# Patient Record
Sex: Female | Born: 1985 | Race: White | Hispanic: No | Marital: Single | Smoking: Current every day smoker
Health system: Southern US, Community
[De-identification: ages and names within clinical notes are randomized; demographics above are authoritative.]

## PROBLEM LIST (undated history)

## (undated) ENCOUNTER — Inpatient Hospital Stay (HOSPITAL_COMMUNITY): Payer: Self-pay

## (undated) DIAGNOSIS — F319 Bipolar disorder, unspecified: Secondary | ICD-10-CM

## (undated) DIAGNOSIS — Z3490 Encounter for supervision of normal pregnancy, unspecified, unspecified trimester: Principal | ICD-10-CM

## (undated) DIAGNOSIS — F329 Major depressive disorder, single episode, unspecified: Secondary | ICD-10-CM

## (undated) DIAGNOSIS — F32A Depression, unspecified: Secondary | ICD-10-CM

## (undated) HISTORY — PX: TONSILLECTOMY: SUR1361

## (undated) HISTORY — PX: BREAST SURGERY: SHX581

---

## 2008-09-23 ENCOUNTER — Emergency Department (HOSPITAL_COMMUNITY): Admission: EM | Admit: 2008-09-23 | Discharge: 2008-09-24 | Payer: Self-pay | Admitting: Emergency Medicine

## 2008-12-20 ENCOUNTER — Inpatient Hospital Stay (HOSPITAL_COMMUNITY): Admission: AD | Admit: 2008-12-20 | Discharge: 2008-12-20 | Payer: Self-pay | Admitting: Obstetrics & Gynecology

## 2008-12-20 ENCOUNTER — Emergency Department (HOSPITAL_COMMUNITY): Admission: EM | Admit: 2008-12-20 | Discharge: 2008-12-20 | Payer: Self-pay | Admitting: Emergency Medicine

## 2008-12-22 ENCOUNTER — Inpatient Hospital Stay (HOSPITAL_COMMUNITY): Admission: AD | Admit: 2008-12-22 | Discharge: 2008-12-22 | Payer: Self-pay | Admitting: Obstetrics & Gynecology

## 2009-02-19 ENCOUNTER — Emergency Department (HOSPITAL_COMMUNITY): Admission: EM | Admit: 2009-02-19 | Discharge: 2009-02-20 | Payer: Self-pay | Admitting: Emergency Medicine

## 2009-04-07 ENCOUNTER — Emergency Department (HOSPITAL_COMMUNITY): Admission: EM | Admit: 2009-04-07 | Discharge: 2009-04-07 | Payer: Self-pay | Admitting: Emergency Medicine

## 2010-11-09 ENCOUNTER — Inpatient Hospital Stay (HOSPITAL_COMMUNITY)
Admission: AD | Admit: 2010-11-09 | Discharge: 2010-11-09 | Payer: Self-pay | Source: Home / Self Care | Attending: Obstetrics and Gynecology | Admitting: Obstetrics and Gynecology

## 2010-11-11 ENCOUNTER — Inpatient Hospital Stay (HOSPITAL_COMMUNITY): Admission: AD | Admit: 2010-11-11 | Payer: Self-pay | Source: Home / Self Care | Admitting: Obstetrics and Gynecology

## 2011-01-28 LAB — GC/CHLAMYDIA PROBE AMP, GENITAL
Chlamydia, DNA Probe: NEGATIVE
GC Probe Amp, Genital: NEGATIVE

## 2011-01-28 LAB — CBC
Hemoglobin: 12 g/dL (ref 12.0–15.0)
MCHC: 33.3 g/dL (ref 30.0–36.0)
RDW: 12.8 % (ref 11.5–15.5)
WBC: 10 10*3/uL (ref 4.0–10.5)

## 2011-01-28 LAB — WET PREP, GENITAL: Trich, Wet Prep: NONE SEEN

## 2011-01-28 LAB — ABO/RH: ABO/RH(D): B POS

## 2011-01-28 LAB — HCG, QUANTITATIVE, PREGNANCY: hCG, Beta Chain, Quant, S: 35 m[IU]/mL — ABNORMAL HIGH (ref <5)

## 2011-02-27 LAB — URINALYSIS, ROUTINE W REFLEX MICROSCOPIC
Bilirubin Urine: NEGATIVE
Hgb urine dipstick: NEGATIVE
Protein, ur: NEGATIVE mg/dL
Urobilinogen, UA: 0.2 mg/dL (ref 0.0–1.0)

## 2011-02-27 LAB — URINE MICROSCOPIC-ADD ON

## 2011-03-05 LAB — GC/CHLAMYDIA PROBE AMP, GENITAL
Chlamydia, DNA Probe: NEGATIVE
GC Probe Amp, Genital: NEGATIVE

## 2011-03-05 LAB — POCT URINALYSIS DIP (DEVICE)
Glucose, UA: NEGATIVE mg/dL
Protein, ur: NEGATIVE mg/dL
Specific Gravity, Urine: 1.01 (ref 1.005–1.030)
pH: 8.5 — ABNORMAL HIGH (ref 5.0–8.0)

## 2011-03-05 LAB — CBC
MCHC: 33.5 g/dL (ref 30.0–36.0)
MCV: 84.2 fL (ref 78.0–100.0)
RBC: 4.58 MIL/uL (ref 3.87–5.11)
RDW: 12.8 % (ref 11.5–15.5)
WBC: 10.7 10*3/uL — ABNORMAL HIGH (ref 4.0–10.5)

## 2011-03-05 LAB — WET PREP, GENITAL: Trich, Wet Prep: NONE SEEN

## 2011-03-05 LAB — POCT PREGNANCY, URINE: Preg Test, Ur: POSITIVE

## 2011-03-31 ENCOUNTER — Emergency Department (HOSPITAL_COMMUNITY)
Admission: EM | Admit: 2011-03-31 | Discharge: 2011-03-31 | Disposition: A | Discharge status: Home / Self Care | Payer: Self-pay | Attending: Emergency Medicine | Admitting: Emergency Medicine

## 2011-03-31 DIAGNOSIS — F319 Bipolar disorder, unspecified: Secondary | ICD-10-CM | POA: Insufficient documentation

## 2011-03-31 DIAGNOSIS — R21 Rash and other nonspecific skin eruption: Secondary | ICD-10-CM | POA: Insufficient documentation

## 2011-03-31 DIAGNOSIS — L259 Unspecified contact dermatitis, unspecified cause: Secondary | ICD-10-CM | POA: Insufficient documentation

## 2011-03-31 DIAGNOSIS — Z79899 Other long term (current) drug therapy: Secondary | ICD-10-CM | POA: Insufficient documentation

## 2011-04-29 ENCOUNTER — Inpatient Hospital Stay (HOSPITAL_COMMUNITY)
Admission: RE | Admit: 2011-04-29 | Discharge: 2011-04-29 | Disposition: A | Discharge status: Home / Self Care | Payer: Self-pay | Source: Ambulatory Visit | Attending: Emergency Medicine | Admitting: Emergency Medicine

## 2011-06-18 ENCOUNTER — Emergency Department (HOSPITAL_COMMUNITY)
Admission: EM | Admit: 2011-06-18 | Discharge: 2011-06-19 | Disposition: A | Discharge status: Home / Self Care | Payer: Self-pay | Attending: Emergency Medicine | Admitting: Emergency Medicine

## 2011-06-18 DIAGNOSIS — J45909 Unspecified asthma, uncomplicated: Secondary | ICD-10-CM | POA: Insufficient documentation

## 2011-06-18 DIAGNOSIS — R220 Localized swelling, mass and lump, head: Secondary | ICD-10-CM | POA: Insufficient documentation

## 2011-06-18 DIAGNOSIS — F319 Bipolar disorder, unspecified: Secondary | ICD-10-CM | POA: Insufficient documentation

## 2011-06-18 DIAGNOSIS — I10 Essential (primary) hypertension: Secondary | ICD-10-CM | POA: Insufficient documentation

## 2011-06-18 DIAGNOSIS — K047 Periapical abscess without sinus: Secondary | ICD-10-CM | POA: Insufficient documentation

## 2011-08-20 LAB — CBC
HCT: 38.9
Hemoglobin: 13.3
MCHC: 34.2
MCV: 83.4
Platelets: 241
RDW: 14.1

## 2011-08-20 LAB — URINALYSIS, ROUTINE W REFLEX MICROSCOPIC
Protein, ur: 30 — AB
Specific Gravity, Urine: 1.016
Urobilinogen, UA: 0.2

## 2011-08-20 LAB — DIFFERENTIAL
Basophils Absolute: 0.1
Basophils Relative: 1
Eosinophils Absolute: 0.1
Eosinophils Relative: 1
Monocytes Absolute: 0.6

## 2011-08-20 LAB — POCT I-STAT, CHEM 8
BUN: 6
Calcium, Ion: 1.18
Hemoglobin: 12.9
Sodium: 141
TCO2: 24

## 2011-08-20 LAB — URINE MICROSCOPIC-ADD ON

## 2011-09-28 ENCOUNTER — Emergency Department (HOSPITAL_COMMUNITY): Payer: Self-pay

## 2011-09-28 ENCOUNTER — Encounter: Payer: Self-pay | Admitting: *Deleted

## 2011-09-28 ENCOUNTER — Emergency Department (HOSPITAL_COMMUNITY)
Admission: EM | Admit: 2011-09-28 | Discharge: 2011-09-28 | Disposition: A | Discharge status: Home / Self Care | Payer: Self-pay | Attending: Emergency Medicine | Admitting: Emergency Medicine

## 2011-09-28 DIAGNOSIS — M79609 Pain in unspecified limb: Secondary | ICD-10-CM | POA: Insufficient documentation

## 2011-09-28 DIAGNOSIS — J45909 Unspecified asthma, uncomplicated: Secondary | ICD-10-CM | POA: Insufficient documentation

## 2011-09-28 DIAGNOSIS — W230XXA Caught, crushed, jammed, or pinched between moving objects, initial encounter: Secondary | ICD-10-CM | POA: Insufficient documentation

## 2011-09-28 DIAGNOSIS — S61209A Unspecified open wound of unspecified finger without damage to nail, initial encounter: Secondary | ICD-10-CM | POA: Insufficient documentation

## 2011-09-28 DIAGNOSIS — S6990XA Unspecified injury of unspecified wrist, hand and finger(s), initial encounter: Secondary | ICD-10-CM

## 2011-09-28 HISTORY — DX: Depression, unspecified: F32.A

## 2011-09-28 HISTORY — DX: Major depressive disorder, single episode, unspecified: F32.9

## 2011-09-28 MED ORDER — DOXYCYCLINE HYCLATE 100 MG PO CAPS: 100.0000 mg | ORAL_CAPSULE | Freq: Two times a day (BID) | ORAL | Status: AC

## 2011-09-28 MED ORDER — HYDROMORPHONE HCL PF 2 MG/ML IJ SOLN
2.0000 mg | Freq: Once | INTRAMUSCULAR | Status: AC
  Administered 2011-09-28: 2 mg via INTRAVENOUS
  Filled 2011-09-28: qty 1

## 2011-09-28 MED ORDER — OXYCODONE-ACETAMINOPHEN 5-325 MG PO TABS: 1.0000 | ORAL_TABLET | Freq: Four times a day (QID) | ORAL | Status: AC | PRN

## 2011-09-28 MED ORDER — HYDROMORPHONE HCL PF 1 MG/ML IJ SOLN
1.0000 mg | Freq: Once | INTRAMUSCULAR | Status: AC
  Administered 2011-09-28: 1 mg via INTRAMUSCULAR
  Filled 2011-09-28: qty 1

## 2011-09-28 MED ORDER — TETANUS-DIPHTH-ACELL PERTUSSIS 5-2.5-18.5 LF-MCG/0.5 IM SUSP
0.5000 mL | Freq: Once | INTRAMUSCULAR | Status: AC
  Administered 2011-09-28: 0.5 mL via INTRAMUSCULAR
  Filled 2011-09-28: qty 0.5

## 2011-09-28 NOTE — ED Notes (Signed)
Pt is no longer crying.  Feels better.  Pain decreased from 10/10 - 6/10

## 2011-09-28 NOTE — ED Notes (Signed)
Dressing.  Cleaned wound, applied bacitracing and non stick dressing with gauze over it.  Pt tol well.  Wound care instructions given

## 2011-09-28 NOTE — ED Notes (Signed)
Pt got right pinkie finger caught b/w door and chair and now appears to have been smashed and nail missing

## 2011-09-28 NOTE — ED Provider Notes (Signed)
History     CSN: [id number] Arrival date & time: 09/28/2011  1:51 PM   First MD Initiated Contact with Patient 09/28/11 1720      Chief Complaint  Patient presents with  . Finger Injury    right pinkie    (Consider location/radiation/quality/duration/timing/severity/associated sxs/prior treatment) Patient is a 25 y.o. female presenting with hand injury. The history is provided by the patient (The patient crushed her right small finger now in a door frame they removed her).  Hand Injury  The incident occurred 1 to 2 hours ago. The incident occurred at home. The injury mechanism was a direct blow. The pain is present in the right fingers. The quality of the pain is described as aching. The pain is at a severity of 5/10. The pain is moderate. The pain has been constant since the incident. Pertinent negatives include no fever. She reports no foreign bodies present. The symptoms are aggravated by movement. She has tried immobilization for the symptoms.    Past Medical History  Diagnosis Date  . Asthma   . Depression     Past Surgical History  Procedure Date  . Tonsillectomy   . Breast surgery     No family history on file.  History  Substance Use Topics  . Smoking status: Current Everyday Smoker  . Smokeless tobacco: Not on file  . Alcohol Use: No    OB History    Grav Para Term Preterm Abortions TAB SAB Ect Mult Living                  Review of Systems  Constitutional: Negative for fever and fatigue.  HENT: Negative for congestion, sinus pressure and ear discharge.   Eyes: Negative for discharge.  Respiratory: Negative for cough.   Cardiovascular: Negative for chest pain.  Gastrointestinal: Negative for abdominal pain and diarrhea.  Genitourinary: Negative for frequency and hematuria.  Musculoskeletal: Negative for back pain.       Pain in right small finger  Skin: Negative for rash.  Neurological: Negative for seizures and headaches.  Hematological: Negative.    Psychiatric/Behavioral: Negative for hallucinations.    Allergies  Latex and Lamictal  Home Medications   Current Outpatient Rx  Name Route Sig Dispense Refill  . SERTRALINE HCL 50 MG PO TABS Oral Take 50 mg by mouth daily.      Marland Kitchen DOXYCYCLINE HYCLATE 100 MG PO CAPS Oral Take 1 capsule (100 mg total) by mouth 2 (two) times daily. 14 capsule 0  . OXYCODONE-ACETAMINOPHEN 5-325 MG PO TABS Oral Take 1 tablet by mouth every 6 (six) hours as needed for pain. 20 tablet 0    BP 136/88  Pulse 52  Temp(Src) 98.7 F (37.1 C) (Oral)  Resp 22  SpO2 99%  Physical Exam  Constitutional: She is oriented to person, place, and time. She appears well-developed.  HENT:  Head: Normocephalic.  Eyes: Conjunctivae are normal.  Neck: No tracheal deviation present.  Musculoskeletal:       Patient has injury to her right small finger she has full range of motion but her nail was removed she has a nailbed laceration neurovascular exam normal  Neurological: She is oriented to person, place, and time.  Skin: Skin is warm.  Psychiatric: She has a normal mood and affect.    ED Course  Procedures (including critical care time)  Labs Reviewed - No data to display Dg Hand Complete Right  09/28/2011  *RADIOLOGY REPORT*  Clinical Data: Finger injury  RIGHT  HAND - COMPLETE 3+ VIEW  Comparison: None.  Findings: There is an injury to the tuft of the distal phalanx of the fifth digit.  There is a fracture of the tuft.  No soft tissue injury.  The DIP joint appears normal.  IMPRESSION: Fracture of the tuft of the fifth digit distal phalanx.  Original Report Authenticated By: Genevive Bi, M.D.     1. Finger injury    Results for orders placed during the hospital encounter of 11/09/10  POCT PREGNANCY, URINE      Component Value Range   Preg Test, Ur       Value: POSITIVE            THE SENSITIVITY OF THIS     METHODOLOGY IS >24 mIU/mL  WET PREP, GENITAL      Component Value Range   Yeast, Wet Prep  NONE SEEN  NONE SEEN    Trich, Wet Prep NONE SEEN  NONE SEEN    Clue Cells, Wet Prep FEW (*) NONE SEEN    WBC, Wet Prep HPF POC FEW MANY BACTERIA SEEN (*) NONE SEEN   GC/CHLAMYDIA PROBE AMP, GENITAL      Component Value Range   GC Probe Amp, Genital    NEGATIVE    Value: NEGATIVE     (NOTE)  Testing performed using the BD ProbeTec Qx Chlamydia trachomatis and Neisseria gonorrhea amplified DNA assay.  Performed at:  [facility]               [address]                    [place], [place] [address]               [id number]   Chlamydia, DNA Probe    NEGATIVE    Value: NEGATIVE     (NOTE)  Testing performed using the BD ProbeTec Qx Chlamydia trachomatis and Neisseria gonorrhea amplified DNA assay.  Performed at:  [facility]               [address]                    [place], [place] [address]               [id number]  ABO/RH      Component Value Range   ABO/RH(D) B POS    CBC      Component Value Range   WBC 10.0  4.0 - 10.5 (K/uL)   RBC 4.38  3.87 - 5.11 (MIL/uL)   Hemoglobin 12.0  12.0 - 15.0 (g/dL)   HCT 57.8  46.9 - 62.9 (%)   MCV 82.2  78.0 - 100.0 (fL)   MCH 27.4  26.0 - 34.0 (pg)   MCHC 33.3  30.0 - 36.0 (g/dL)   RDW 52.8  41.3 - 24.4 (%)   Platelets 231  150 - 400 (K/uL)  HCG, QUANTITATIVE, PREGNANCY      Component Value Range   hCG, Beta Chain, Quant, S   (*) <5 (mIU/mL)   Value: 35              GEST. AGE      CONC.  (mIU/mL)       <=  1 WEEK        5 - 50         2 WEEKS       50 - 500         3 WEEKS       100 - 10,000         4 WEEKS     1,000 - 30,000         5 WEEKS     3,500 - 115,000       6-8 WEEKS     12,000 - 270,000        12 WEEKS     15,000 - 220,000                FEMALE AND NON-PREGNANT FEMALE:         LESS THAN 5 mIU/mL   Dg Hand Complete Right  09/28/2011  *RADIOLOGY REPORT*  Clinical Data: Finger injury  RIGHT HAND - COMPLETE 3+  VIEW  Comparison: None.  Findings: There is an injury to the tuft of the distal phalanx of the fifth digit.  There is a fracture of the tuft.  No soft tissue injury.  The DIP joint appears normal.  IMPRESSION: Fracture of the tuft of the fifth digit distal phalanx.  Original Report Authenticated By: Genevive Bi, M.D.   Patient was referred to    MDM          Benny Lennert, MD 09/28/11 [phone]

## 2011-09-28 NOTE — ED Notes (Signed)
Family is at the bedside.  Severe pain.  Crying.  Pain 9/10.  EDP notified, pain 9/10.  New orders received

## 2011-10-31 ENCOUNTER — Encounter (HOSPITAL_COMMUNITY): Payer: Self-pay | Admitting: *Deleted

## 2011-10-31 ENCOUNTER — Inpatient Hospital Stay (HOSPITAL_COMMUNITY)
Admission: AD | Admit: 2011-10-31 | Discharge: 2011-11-01 | Disposition: A | Discharge status: Home / Self Care | Payer: Medicaid Other | Source: Ambulatory Visit | Attending: Obstetrics & Gynecology | Admitting: Obstetrics & Gynecology

## 2011-10-31 ENCOUNTER — Inpatient Hospital Stay (HOSPITAL_COMMUNITY): Payer: Medicaid Other

## 2011-10-31 ENCOUNTER — Emergency Department (INDEPENDENT_AMBULATORY_CARE_PROVIDER_SITE_OTHER)
Admission: EM | Admit: 2011-10-31 | Discharge: 2011-10-31 | Disposition: A | Discharge status: Other Acute Inpatient Hospital | Payer: Self-pay | Source: Home / Self Care | Attending: Emergency Medicine | Admitting: Emergency Medicine

## 2011-10-31 DIAGNOSIS — O210 Mild hyperemesis gravidarum: Secondary | ICD-10-CM

## 2011-10-31 LAB — POCT I-STAT, CHEM 8
BUN: 6 mg/dL (ref 6–23)
Calcium, Ion: 1.15 mmol/L (ref 1.12–1.32)
Hemoglobin: 15 g/dL (ref 12.0–15.0)
Sodium: 136 mEq/L (ref 135–145)
TCO2: 25 mmol/L (ref 0–100)

## 2011-10-31 LAB — URINALYSIS, ROUTINE W REFLEX MICROSCOPIC
Glucose, UA: NEGATIVE mg/dL
Hgb urine dipstick: NEGATIVE
Ketones, ur: >80 mg/dL — AB
Protein, ur: 100 mg/dL — AB
Urobilinogen, UA: 1 mg/dL (ref 0.0–1.0)

## 2011-10-31 LAB — URINE MICROSCOPIC-ADD ON

## 2011-10-31 LAB — CBC
MCH: 27.6 pg (ref 26.0–34.0)
MCV: 80 fL (ref 78.0–100.0)
Platelets: 289 10*3/uL (ref 150–400)
RDW: 13.6 % (ref 11.5–15.5)
WBC: 13.9 10*3/uL — ABNORMAL HIGH (ref 4.0–10.5)

## 2011-10-31 LAB — POCT PREGNANCY, URINE: Preg Test, Ur: POSITIVE

## 2011-10-31 MED ORDER — ONDANSETRON 4 MG PO TBDP
8.0000 mg | ORAL_TABLET | Freq: Once | ORAL | Status: AC
  Administered 2011-10-31: 8 mg via ORAL

## 2011-10-31 MED ORDER — LACTATED RINGERS IV SOLN
INTRAVENOUS | Status: DC
  Administered 2011-10-31: 22:00:00 via INTRAVENOUS

## 2011-10-31 MED ORDER — ONDANSETRON 4 MG PO TBDP
4.0000 mg | ORAL_TABLET | Freq: Three times a day (TID) | ORAL | Status: DC | PRN
  Administered 2011-10-31: 4 mg via ORAL
  Filled 2011-10-31: qty 1

## 2011-10-31 MED ORDER — ONDANSETRON 4 MG PO TBDP
ORAL_TABLET | ORAL | Status: AC
  Filled 2011-10-31: qty 2

## 2011-10-31 NOTE — ED Notes (Signed)
C/o vomiting and states she cannot keep anything down x 3 days.  States emesis has become brownish red with blood in it.  Also c/o diarrhea. Pt. States last period Oct. 7, 2012 / she is pregnant but has not had a Dr's appt. Yet.

## 2011-10-31 NOTE — Progress Notes (Signed)
PT SAYS  SHE LEFT  URGENT CARE-    GAVE MED FOR NAUSEA. TOLD HER TO COME HERE FOR ADMISSION.  N/V HAS BEEN GOING X2 WEEKS-      HAS VOMITED 9X TODAY.

## 2011-10-31 NOTE — ED Provider Notes (Signed)
History     CSN: [id number] Arrival date & time: 10/31/2011  6:41 PM   First MD Initiated Contact with Patient 10/31/11 1809      Chief Complaint  Patient presents with  . Emesis    (Consider location/radiation/quality/duration/timing/severity/associated sxs/prior treatment) HPI Comments: Anniah is a 25 year old female who is about 2 months pregnant. She has had a two-week history of continuous vomiting of up to 7-8 times per day. For the past 3 days she's had blood in her vomitus which has been sometimes brown and sometimes red. Sometimes as little as teaspoonful and sometimes as much as a cup. She has had loose stools for the past 2 days, she's felt tired and chilled. Her last menstrual period was October 17. She doesn't have an obstetrician so far as had no prenatal care. She is still smoking. 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 She doesn't have any vaginal bleeding or pelvic pain.  Patient is a 25 y.o. female presenting with vomiting.  Emesis  Associated symptoms include diarrhea. Pertinent negatives include no abdominal pain, no chills, no cough and no fever.    Past Medical History  Diagnosis Date  . Asthma   . Depression     Past Surgical History  Procedure Date  . Tonsillectomy   . Breast surgery     History reviewed. No pertinent family history.  History  Substance Use Topics  . Smoking status: Current Everyday Smoker  . Smokeless tobacco: Not on file  . Alcohol Use: No    OB History    Grav Para Term Preterm Abortions TAB SAB Ect Mult Living                  Review of Systems  Constitutional: Negative for fever, chills, appetite change and unexpected weight change.  Respiratory: Negative for cough, shortness of breath and wheezing.   Cardiovascular: Negative for chest pain.  Gastrointestinal: Positive for nausea, vomiting and diarrhea. Negative for abdominal pain, constipation, blood in stool, abdominal distention, anal  bleeding and rectal pain.  Genitourinary: Negative for dysuria, urgency and frequency.  Skin: Negative for rash.    Allergies  Latex and Lamictal  Home Medications   Current Outpatient Rx  Name Route Sig Dispense Refill  . SERTRALINE HCL 50 MG PO TABS Oral Take 50 mg by mouth daily.        BP 132/83  Pulse 104  Temp(Src) 98.8 F (37.1 C) (Oral)  Resp 18  SpO2 98%  LMP 08/25/2011  Physical Exam  Nursing note and vitals reviewed. Constitutional: She appears well-developed and well-nourished. No distress.  Eyes: No scleral icterus.  Cardiovascular: Normal rate, regular rhythm and normal heart sounds.  Exam reveals no gallop and no friction rub.   No murmur heard. Pulmonary/Chest: Effort normal and breath sounds normal. No respiratory distress. She has no wheezes. She has no rales.  Abdominal: Soft. Bowel sounds are normal. She exhibits no distension and no mass. There is no hepatosplenomegaly. There is no tenderness. There is no rebound, no guarding and no CVA tenderness.  Skin: Skin is warm and dry. No rash noted. She is not diaphoretic.    ED Course  Procedures (including critical care time)  Labs Reviewed  POCT I-STAT, CHEM 8 - Abnormal; Notable for the following:    Potassium 3.2 (*)    All other components within normal limits  POCT PREGNANCY, URINE  I-STAT, CHEM 8  POCT PREGNANCY, URINE   No results found.  1. Hyperemesis gravidarum       MDM  She has hyperemesis gravidarum which is complicated by several things: First of all upper GI bleeding which may be do to a Mallory-Weiss tear, second she is a high-risk pregnancy with no prenatal care and 2 prior miscarriages, third she has hypokalemia and orthostatic drop in blood pressure. Because of all these things we are transferring her to [facility] hospital by private vehicle. Her husband is here with her and has agreed to take her right over there. We called and gave report to the charge  nurse.        Roque Lias, MD 10/31/11 [phone]

## 2011-10-31 NOTE — ED Provider Notes (Signed)
History   .25 yo Patient's last menstrual period was 08/25/2011.[redacted]w[redacted]d. She was seen at Urgent Care due to 2 weeks of persistent nausea and vomiting. Zofran ODT was given about 1-2 hrs ago and her symptoms have improved but has not attempted to take fluids by mouth yet. No pain, no bleeding.     HPI  OB History    Grav Para Term Preterm Abortions TAB SAB Ect Mult Living   3    2  2    0    2 first trimester SAb. No surgery.  Past Medical History  Diagnosis Date  . Asthma   . Depression     Past Surgical History  Procedure Date  . Tonsillectomy   . Breast surgery     History reviewed. No pertinent family history.  History  Substance Use Topics  . Smoking status: Current Everyday Smoker  . Smokeless tobacco: Not on file  . Alcohol Use: No    Allergies:  Allergies  Allergen Reactions  . Latex     rash  . Lamictal (Lamotrigine) Rash    Prescriptions prior to admission  Medication Sig Dispense Refill  . sertraline (ZOLOFT) 50 MG tablet Take 50 mg by mouth daily.          Review of Systems  Constitutional: Positive for weight loss. Negative for fever and chills.  Gastrointestinal: Positive for nausea and vomiting.       Blood in vomitus.  Genitourinary: Negative for dysuria and frequency.    Physical Exam   Blood pressure 118/66, pulse 59, temperature 98.9 F (37.2 C), temperature source Oral, resp. rate 18, height 5\' 6"  (1.676 m), weight 158 lb 4 oz (71.782 kg), last menstrual period 08/25/2011.  Physical Exam  Constitutional: She appears well-developed. No distress.  GI: Soft. She exhibits no distension and no mass. There is no tenderness.   CBC    Component Value Date/Time   WBC 13.9* 10/31/2011 2201   RBC 4.50 10/31/2011 2201   HGB 12.4 10/31/2011 2201   HCT 36.0 10/31/2011 2201   PLT 289 10/31/2011 2201   MCV 80.0 10/31/2011 2201   MCH 27.6 10/31/2011 2201   MCHC 34.4 10/31/2011 2201   RDW 13.6 10/31/2011 2201   LYMPHSABS 3.2 09/23/2008 2255   MONOABS 0.6 09/23/2008 2255   EOSABS 0.1 09/23/2008 2255   BASOSABS 0.1 09/23/2008 2255   US OB Comp Less 14 Wks Status:  Final result                     Study Result     *RADIOLOGY REPORT*  Clinical Data: Recurrent abortion. Hyper emesis. Beta HCG  pending. Gestational age by LMP is 9 weeks 4 days.  OBSTETRIC <14 WK ULTRASOUND  Technique: Transabdominal ultrasound was performed for evaluation  of the gestation as well as the maternal uterus and adnexal  regions.  Comparison: None.  Intrauterine gestational sac: A single intrauterine pregnancy is  visualized.  Yolk sac: Present  Embryo: Present  Cardiac Activity: Demonstrated  Heart Rate: bpm  CRL: 14 mm 7w 6d [place] EDC: 06/12/2012  Maternal uterus/Adnexae:  No focal myometrial masses are suggested. The right ovary measures  2.9 x 1.5 x 1.9 cm. No abnormal adnexal masses. The left ovary  measures 2.4 x 1.3 x 1.7 cm. No abnormal adnexal masses. No  significant free fluid.  IMPRESSION:  Single intrauterine pregnancy demonstrated with estimated  gestational age by crown-rump length 7 weeks 6 days.  Original Report Authenticated By: Chrissie Noa  R. STEVENS, M.D.      MAU Course  Procedures  MDM 7 weeks 6 days viable IUP, nausea and vomiting of pregnancy.  Assessment and Plan  Tolerating oral fluids after hydration and Zofran. Rx Phenergan 25 mg tabs po q4-6 for nausea. Make appt for prenatal care at health dept as she planned. Instructions given.  ARNOLD,JAMES 10/31/2011, 9:45 PM

## 2011-11-01 LAB — TSH: TSH: 0.799 u[IU]/mL (ref 0.350–4.500)

## 2011-11-01 MED ORDER — PROMETHAZINE HCL 25 MG PO TABS: 25.0000 mg | ORAL_TABLET | Freq: Four times a day (QID) | ORAL | Status: AC | PRN

## 2011-11-06 ENCOUNTER — Encounter (HOSPITAL_COMMUNITY): Payer: Self-pay | Admitting: *Deleted

## 2011-11-19 NOTE — L&D Delivery Note (Signed)
Delivery Note At 8:58 AM a viable female was delivered via Vaginal, Spontaneous Delivery (Presentation:OA ; LOT  ).  APGAR: 8, 10; weight P .   Placenta status: delivered, intact .  Cord:  with the following complications: none .    Anesthesia: Epidural  Episiotomy:N/A   Lacerations: 2nd degree perineal, labial (hemostatic) Suture Repair: 3.0 vicryl rapide Est. Blood Loss (mL): 500  Mom to postpartum.  Baby to with mommy.  BOVARD,Deakon Frix 06/15/2012, 9:23 AM   Br/ B+/Contra?/ [place]

## 2011-12-04 LAB — OB RESULTS CONSOLE ANTIBODY SCREEN: Antibody Screen: NEGATIVE

## 2011-12-04 LAB — OB RESULTS CONSOLE ABO/RH

## 2011-12-04 LAB — OB RESULTS CONSOLE GC/CHLAMYDIA
Chlamydia: NEGATIVE
Gonorrhea: NEGATIVE

## 2011-12-04 LAB — OB RESULTS CONSOLE RUBELLA ANTIBODY, IGM: Rubella: IMMUNE

## 2011-12-04 LAB — OB RESULTS CONSOLE HIV ANTIBODY (ROUTINE TESTING): HIV: NONREACTIVE

## 2011-12-20 DIAGNOSIS — O210 Mild hyperemesis gravidarum: Secondary | ICD-10-CM

## 2011-12-22 ENCOUNTER — Encounter (HOSPITAL_COMMUNITY): Payer: Self-pay | Admitting: *Deleted

## 2011-12-22 ENCOUNTER — Inpatient Hospital Stay (HOSPITAL_COMMUNITY)
Admission: AD | Admit: 2011-12-22 | Discharge: 2011-12-22 | Disposition: A | Discharge status: Home / Self Care | Payer: Medicaid Other | Source: Ambulatory Visit | Attending: Obstetrics and Gynecology | Admitting: Obstetrics and Gynecology

## 2011-12-22 DIAGNOSIS — O210 Mild hyperemesis gravidarum: Secondary | ICD-10-CM | POA: Insufficient documentation

## 2011-12-22 LAB — URINALYSIS, MICROSCOPIC ONLY
Ketones, ur: >80 mg/dL — AB
Leukocytes, UA: NEGATIVE
Nitrite: NEGATIVE
Specific Gravity, Urine: >1.03 — ABNORMAL HIGH (ref 1.005–1.030)
Urobilinogen, UA: 1 mg/dL (ref 0.0–1.0)
pH: 6 (ref 5.0–8.0)

## 2011-12-22 MED ORDER — ONDANSETRON 8 MG PO TBDP: 8.0000 mg | ORAL_TABLET | Freq: Three times a day (TID) | ORAL | Status: AC | PRN

## 2011-12-22 MED ORDER — ONDANSETRON HCL 4 MG/2ML IJ SOLN
4.0000 mg | Freq: Once | INTRAMUSCULAR | Status: AC
  Administered 2011-12-22: 4 mg via INTRAVENOUS
  Filled 2011-12-22: qty 2

## 2011-12-22 MED ORDER — PROMETHAZINE HCL 25 MG/ML IJ SOLN
25.0000 mg | Freq: Once | INTRAVENOUS | Status: AC
  Administered 2011-12-22: 25 mg via INTRAVENOUS
  Filled 2011-12-22: qty 1

## 2011-12-22 MED ORDER — RANITIDINE HCL 150 MG PO TABS: 150.0000 mg | ORAL_TABLET | Freq: Two times a day (BID) | ORAL | Status: DC

## 2011-12-22 NOTE — Progress Notes (Signed)
Vomiting x 3 days.  Phenergan not helping at home.

## 2011-12-22 NOTE — Progress Notes (Signed)
Pt reports having n/v x 3 days. Cannot keep anything down today.  Having blood streaked emesis. Pt had some phenergan at home  That she took last night without relief.

## 2011-12-22 NOTE — ED Provider Notes (Signed)
History     Chief Complaint  Patient presents with  . Emesis   HPI Kelli Barnes 26 y.o. 15w 2d gestation.  Client reports vomiting for 3 days.  No diarrhea.  Has had periodic vomiting in this pregnancy but none recently until 3 days ago.  Has tried soup and crackers, gingerale and other fluids.  Vomiting in MAU - dark green liquid.   OB History    Grav Para Term Preterm Abortions TAB SAB Ect Mult Living   3    2  2    0      Past Medical History  Diagnosis Date  . Asthma   . Depression     Past Surgical History  Procedure Date  . Tonsillectomy   . Breast surgery     History reviewed. No pertinent family history.  History  Substance Use Topics  . Smoking status: Former Games developer  . Smokeless tobacco: Never Used  . Alcohol Use: No    Allergies:  Allergies  Allergen Reactions  . Latex     rash  . Lamictal (Lamotrigine) Rash    Prescriptions prior to admission  Medication Sig Dispense Refill  . Prenatal Vit-Fe Fumarate-FA (PRENATAL MULTIVITAMIN) TABS Take 1 tablet by mouth daily.        Review of Systems  Gastrointestinal: Positive for nausea and vomiting. Negative for diarrhea.   Physical Exam   Blood pressure 132/70, pulse 74, temperature 98.3 F (36.8 C), temperature source Oral, resp. rate 18, height 5\' 8"  (1.727 m), weight 149 lb 9.6 oz (67.858 kg), last menstrual period 08/25/2011.  Physical Exam  Nursing note and vitals reviewed. Constitutional: She is oriented to person, place, and time. She appears well-developed and well-nourished.  HENT:  Head: Normocephalic.  Eyes: EOM are normal.  Neck: Neck supple.  Musculoskeletal: Normal range of motion.  Neurological: She is alert and oriented to person, place, and time.  Skin: Skin is warm and dry.  Psychiatric: She has a normal mood and affect.    MAU Course  Procedures Results for orders placed during the hospital encounter of 12/22/11 (from the past 24 hour(s))  URINALYSIS, WITH MICROSCOPIC      Status: Abnormal   Collection Time   12/22/11  2:21 PM      Component Value Range   Color, Urine YELLOW  YELLOW    APPearance CLEAR  CLEAR    Specific Gravity, Urine >1.030 (*) 1.005 - 1.030    pH 6.0  5.0 - 8.0    Glucose, UA NEGATIVE  NEGATIVE (mg/dL)   Hgb urine dipstick TRACE (*) NEGATIVE    Bilirubin Urine NEGATIVE  NEGATIVE    Ketones, ur >80 (*) NEGATIVE (mg/dL)   Protein, ur 562 (*) NEGATIVE (mg/dL)   Urobilinogen, UA 1.0  0.0 - 1.0 (mg/dL)   Nitrite NEGATIVE  NEGATIVE    Leukocytes, UA NEGATIVE  NEGATIVE    WBC, UA 0-2  <3 (WBC/hpf)   RBC / HPF 0-2  <3 (RBC/hpf)   Bacteria, UA MANY (*) RARE    Squamous Epithelial / LPF FEW (*) RARE     MDM Will give 1000cc LR with Phenergan 25 mg IV and Zofran 4 mg IVP if needed. 1806.  IV infused.  Client tolerating gingerale after Zofran given.  Feeling much better after IVF.  Wants to go home.  Assessment and Plan  Vomiting in pregnancy  Plan IV fluids given rx zantac 150 mg po bid for upper abdominal pain.  Can also use Tums.  rx zofran 8 mg ODT q 8h if needed for nausea.  Advised to use a stool softener with zofran. If vomiting continues tomorrow, advised to call the office and be seen this week.  Appointment for prenatal care already scheduled is next week.  Skyley Grandmaison 12/22/2011, 3:06 PM   Nolene Bernheim, NP 12/22/11 1816

## 2012-06-14 ENCOUNTER — Encounter (HOSPITAL_COMMUNITY): Payer: Self-pay | Admitting: Anesthesiology

## 2012-06-14 ENCOUNTER — Inpatient Hospital Stay (HOSPITAL_COMMUNITY)
Admission: RE | Admit: 2012-06-14 | Discharge: 2012-06-14 | Payer: Medicaid Other | Source: Ambulatory Visit | Attending: Obstetrics and Gynecology | Admitting: Obstetrics and Gynecology

## 2012-06-14 ENCOUNTER — Encounter (HOSPITAL_COMMUNITY): Payer: Self-pay

## 2012-06-14 ENCOUNTER — Encounter (HOSPITAL_COMMUNITY): Payer: Self-pay | Admitting: *Deleted

## 2012-06-14 ENCOUNTER — Inpatient Hospital Stay (HOSPITAL_COMMUNITY)
Admission: AD | Admit: 2012-06-14 | Discharge: 2012-06-17 | DRG: 775 | Disposition: A | Discharge status: Home / Self Care | Payer: Medicaid Other | Source: Ambulatory Visit | Attending: Obstetrics and Gynecology | Admitting: Obstetrics and Gynecology

## 2012-06-14 ENCOUNTER — Inpatient Hospital Stay (HOSPITAL_COMMUNITY): Payer: Medicaid Other | Admitting: Anesthesiology

## 2012-06-14 DIAGNOSIS — Z3490 Encounter for supervision of normal pregnancy, unspecified, unspecified trimester: Secondary | ICD-10-CM

## 2012-06-14 DIAGNOSIS — O480 Post-term pregnancy: Principal | ICD-10-CM | POA: Diagnosis present

## 2012-06-14 HISTORY — DX: Encounter for supervision of normal pregnancy, unspecified, unspecified trimester: Z34.90

## 2012-06-14 LAB — CBC
HCT: 36.6 % (ref 36.0–46.0)
Hemoglobin: 12.3 g/dL (ref 12.0–15.0)
RBC: 4.52 MIL/uL (ref 3.87–5.11)
RDW: 15.3 % (ref 11.5–15.5)
WBC: 20.3 10*3/uL — ABNORMAL HIGH (ref 4.0–10.5)

## 2012-06-14 MED ORDER — OXYTOCIN BOLUS FROM INFUSION
250.0000 mL | Freq: Once | INTRAVENOUS | Status: DC
  Filled 2012-06-14: qty 500

## 2012-06-14 MED ORDER — PHENYLEPHRINE 40 MCG/ML (10ML) SYRINGE FOR IV PUSH (FOR BLOOD PRESSURE SUPPORT): 80.0000 ug | PREFILLED_SYRINGE | INTRAVENOUS | Status: DC | PRN

## 2012-06-14 MED ORDER — TERBUTALINE SULFATE 1 MG/ML IJ SOLN: 0.2500 mg | Freq: Once | INTRAMUSCULAR | Status: AC | PRN

## 2012-06-14 MED ORDER — BUTORPHANOL TARTRATE 1 MG/ML IJ SOLN
2.0000 mg | INTRAMUSCULAR | Status: DC | PRN
  Administered 2012-06-14 (×2): 2 mg via INTRAVENOUS
  Filled 2012-06-14 (×2): qty 1
  Filled 2012-06-14: qty 2

## 2012-06-14 MED ORDER — LACTATED RINGERS IV SOLN: 500.0000 mL | INTRAVENOUS | Status: DC | PRN

## 2012-06-14 MED ORDER — MISOPROSTOL 25 MCG QUARTER TABLET: 25.0000 ug | ORAL_TABLET | ORAL | Status: DC | PRN

## 2012-06-14 MED ORDER — OXYCODONE-ACETAMINOPHEN 5-325 MG PO TABS: 1.0000 | ORAL_TABLET | ORAL | Status: DC | PRN

## 2012-06-14 MED ORDER — CITRIC ACID-SODIUM CITRATE 334-500 MG/5ML PO SOLN
30.0000 mL | ORAL | Status: DC | PRN
  Administered 2012-06-14: 30 mL via ORAL
  Filled 2012-06-14 (×2): qty 15

## 2012-06-14 MED ORDER — DIPHENHYDRAMINE HCL 50 MG/ML IJ SOLN: 12.5000 mg | INTRAMUSCULAR | Status: DC | PRN

## 2012-06-14 MED ORDER — FLEET ENEMA 7-19 GM/118ML RE ENEM: 1.0000 | ENEMA | RECTAL | Status: DC | PRN

## 2012-06-14 MED ORDER — OXYTOCIN 40 UNITS IN LACTATED RINGERS INFUSION - SIMPLE MED
62.5000 mL/h | Freq: Once | INTRAVENOUS | Status: AC
  Administered 2012-06-15: 999 mL/h via INTRAVENOUS

## 2012-06-14 MED ORDER — EPHEDRINE 5 MG/ML INJ
10.0000 mg | INTRAVENOUS | Status: DC | PRN
  Filled 2012-06-14: qty 4

## 2012-06-14 MED ORDER — FENTANYL 2.5 MCG/ML BUPIVACAINE 1/10 % EPIDURAL INFUSION (WH - ANES)
INTRAMUSCULAR | Status: DC | PRN
  Administered 2012-06-14: 14 mL/h via EPIDURAL

## 2012-06-14 MED ORDER — LACTATED RINGERS IV SOLN
500.0000 mL | Freq: Once | INTRAVENOUS | Status: AC
  Administered 2012-06-14: 1000 mL via INTRAVENOUS

## 2012-06-14 MED ORDER — ACETAMINOPHEN 325 MG PO TABS: 650.0000 mg | ORAL_TABLET | ORAL | Status: DC | PRN

## 2012-06-14 MED ORDER — ONDANSETRON HCL 4 MG/2ML IJ SOLN
4.0000 mg | Freq: Four times a day (QID) | INTRAMUSCULAR | Status: DC | PRN
  Administered 2012-06-15: 4 mg via INTRAVENOUS
  Filled 2012-06-14: qty 2

## 2012-06-14 MED ORDER — FENTANYL 2.5 MCG/ML BUPIVACAINE 1/10 % EPIDURAL INFUSION (WH - ANES)
14.0000 mL/h | INTRAMUSCULAR | Status: DC
  Administered 2012-06-15 (×3): 14 mL/h via EPIDURAL
  Filled 2012-06-14 (×4): qty 60

## 2012-06-14 MED ORDER — SODIUM BICARBONATE 8.4 % IV SOLN
INTRAVENOUS | Status: DC | PRN
  Administered 2012-06-14: 5 mL via EPIDURAL

## 2012-06-14 MED ORDER — LIDOCAINE HCL (PF) 1 % IJ SOLN
30.0000 mL | INTRAMUSCULAR | Status: DC | PRN
  Filled 2012-06-14: qty 30

## 2012-06-14 MED ORDER — EPHEDRINE 5 MG/ML INJ: 10.0000 mg | INTRAVENOUS | Status: DC | PRN

## 2012-06-14 MED ORDER — TERBUTALINE SULFATE 1 MG/ML IJ SOLN: 0.2500 mg | Freq: Once | INTRAMUSCULAR | Status: DC | PRN

## 2012-06-14 MED ORDER — LACTATED RINGERS IV SOLN
INTRAVENOUS | Status: DC
  Administered 2012-06-14 – 2012-06-15 (×3): via INTRAVENOUS

## 2012-06-14 MED ORDER — PHENYLEPHRINE 40 MCG/ML (10ML) SYRINGE FOR IV PUSH (FOR BLOOD PRESSURE SUPPORT)
80.0000 ug | PREFILLED_SYRINGE | INTRAVENOUS | Status: DC | PRN
  Filled 2012-06-14: qty 5

## 2012-06-14 MED ORDER — OXYTOCIN 40 UNITS IN LACTATED RINGERS INFUSION - SIMPLE MED: 1.0000 m[IU]/min | INTRAVENOUS | Status: DC

## 2012-06-14 MED ORDER — IBUPROFEN 600 MG PO TABS
600.0000 mg | ORAL_TABLET | Freq: Four times a day (QID) | ORAL | Status: DC | PRN
  Filled 2012-06-14: qty 1

## 2012-06-14 NOTE — Anesthesia Procedure Notes (Signed)
Epidural Patient location during procedure: OB  Preanesthetic Checklist Completed: patient identified, site marked, surgical consent, pre-op evaluation, timeout performed, IV checked, risks and benefits discussed and monitors and equipment checked  Epidural Patient position: sitting Prep: site prepped and draped and DuraPrep Patient monitoring: continuous pulse ox and blood pressure Approach: midline Injection technique: LOR air  Needle:  Needle type: Tuohy  Needle gauge: 17 G Needle length: 9 cm Needle insertion depth: 5 cm cm Catheter type: closed end flexible Catheter size: 19 Gauge Catheter at skin depth: 10 cm Test dose: negative  Assessment Events: blood not aspirated, injection not painful, no injection resistance, negative IV test and no paresthesia  Additional Notes Dosing of Epidural:  1st dose, through needle ............................................. epi 1:200K + Xylocaine 40 mg  2nd dose, through catheter, after waiting 3 minutes.....epi 1:200K + Xylocaine 60 mg  3rd dose, through catheter after waiting 3 minutes .............................Marcaine   5mg   ( mg Marcaine are expressed as equivilent  cc's medication removed from the 0.1%Bupiv / fentanyl syringe from L&D pump)  ( 2% Xylo charted as a single dose in Epic Meds for ease of charting; actual dosing was fractionated as above, for saftey's sake)  As each dose occurred, patient was free of IV sx; and patient exhibited no evidence of SA injection.  Patient is more comfortable after epidural dosed. Please see RN's note for documentation of vital signs,and FHR which are stable.  Patient reminded not to try to ambulate with numb legs, and that an RN must be present when she attempts to get up.       

## 2012-06-14 NOTE — H&P (Signed)
ANASTACIA REINECKE is a 26 y.o. female G3 P0020 at 23+ for iol secondary to postdates.  +FM, no LOF, no VB, occ ctx, uncomplicated pnc except hyperemesis.  gbbs neg.  D/w pt r/b/a Maternal Medical History:  Fetal activity: Perceived fetal activity is normal.      OB History    Grav Para Term Preterm Abortions TAB SAB Ect Mult Living   3    2  2    0    G1&2 SAB, G3 present, +abn pap - LGSIL with pregnancy, nl colpo, no STDs Past Medical History  Diagnosis Date  . Asthma   . Depression   . Normal pregnancy 06/14/2012   Past Surgical History  Procedure Date  . Tonsillectomy   . Breast surgery    Family History: DM, COPD, Lung Dz, Lung CA, throat CA. Social History:  reports that she has quit smoking. She has never used smokeless tobacco. She reports that she uses illicit drugs (Marijuana). She reports that she does not drink alcohol.single  Meds PNV All Latex, Lamictal   Prenatal Transfer Tool  Maternal Diabetes: Noneg Genetic Screening: NormalWNL Maternal Ultrasounds/Referrals: Normalnone Fetal Ultrasounds or other Referrals:  Nonenone Maternal Substance Abuse:  Yes:  Type: Smoker, Marijuanatob, MJ Significant Maternal Medications:  Noneneg Significant Maternal Lab Results:  Lab values include: Group B Strep negativegbbs neg Other Comments:  Nonenone  Review of Systems  Constitutional: Negative.   HENT: Negative.   Eyes: Negative.   Respiratory: Negative.   Cardiovascular: Negative.   Gastrointestinal: Negative.   Genitourinary: Negative.   Musculoskeletal: Negative.   Skin: Negative.   Neurological: Negative.   Psychiatric/Behavioral: Negative.       Last menstrual period 08/25/2011. Maternal Exam:  Abdomen: Fundal height is appropriate for gestation.   Estimated fetal weight is 7#.   Fetal presentation: vertex  Pelvis: adequate for delivery.      Physical Exam  Constitutional: She is oriented to person, place, and time. She appears well-developed and  well-nourished.  HENT:  Head: Normocephalic and atraumatic.  Eyes: Pupils are equal, round, and reactive to light.  Neck: Normal range of motion. Neck supple. No thyromegaly present.  Cardiovascular: Normal rate and regular rhythm.   Respiratory: Effort normal and breath sounds normal. No respiratory distress.  GI: Soft. Bowel sounds are normal. There is no tenderness.  Musculoskeletal: Normal range of motion.  Neurological: She is alert and oriented to person, place, and time.  Skin: Skin is warm and dry.  Psychiatric: She has a normal mood and affect. Her behavior is normal.    Prenatal labs: ABO, Rh:  B+ Antibody:  neg Rubella:  Immune RPR:   NR HBsAg:   neg HIV:   neg GBS:   neg Hgb 11.9/ Pap LGSIL/plt 316K/gc neg/chl neg/first tri screen neg/ AFP neg/CF neg/ glucola 117  [place] EDC 7/26, nl anat/ female/ sm fibroids/ ant plac  Tdap 5/3  Assessment/Plan: 26 yo G3 P0020 at 40+ for iol gbbs neg Cytotec, AROM and pitocin for IOL Epidural for comfort   BOVARD,Ellon Marasco 06/14/2012, 2:44 PM

## 2012-06-14 NOTE — Progress Notes (Signed)
Pt states she was treated for depression about 1 year ago

## 2012-06-14 NOTE — MAU Note (Signed)
Pt states she started having contractions yesterday and they became worse about 5Am

## 2012-06-14 NOTE — Anesthesia Preprocedure Evaluation (Signed)

## 2012-06-14 NOTE — MAU Note (Signed)
Pt reports having ctx on and off since yesterday. Getting strong and closer together  q 3-2 5 min . Denies leaking reports some bloody show. Reports good fetal movement. Pt reports having n/v all day.

## 2012-06-15 ENCOUNTER — Encounter (HOSPITAL_COMMUNITY): Payer: Self-pay | Admitting: *Deleted

## 2012-06-15 MED ORDER — DIBUCAINE 1 % RE OINT: 1.0000 "application " | TOPICAL_OINTMENT | RECTAL | Status: DC | PRN

## 2012-06-15 MED ORDER — CALCIUM CARBONATE ANTACID 500 MG PO CHEW: 1.0000 | CHEWABLE_TABLET | Freq: Every day | ORAL | Status: DC | PRN

## 2012-06-15 MED ORDER — ONDANSETRON HCL 4 MG PO TABS: 8.0000 mg | ORAL_TABLET | Freq: Three times a day (TID) | ORAL | Status: DC | PRN

## 2012-06-15 MED ORDER — ONDANSETRON HCL 4 MG/2ML IJ SOLN: 4.0000 mg | INTRAMUSCULAR | Status: DC | PRN

## 2012-06-15 MED ORDER — OXYTOCIN 40 UNITS IN LACTATED RINGERS INFUSION - SIMPLE MED
1.0000 m[IU]/min | INTRAVENOUS | Status: DC
  Administered 2012-06-15: 1 m[IU]/min via INTRAVENOUS
  Filled 2012-06-15: qty 1000

## 2012-06-15 MED ORDER — BENZOCAINE-MENTHOL 20-0.5 % EX AERO
1.0000 "application " | INHALATION_SPRAY | CUTANEOUS | Status: DC | PRN
  Administered 2012-06-15: 1 via TOPICAL
  Filled 2012-06-15: qty 56

## 2012-06-15 MED ORDER — IBUPROFEN 600 MG PO TABS
600.0000 mg | ORAL_TABLET | Freq: Four times a day (QID) | ORAL | Status: DC
  Administered 2012-06-15 – 2012-06-17 (×8): 600 mg via ORAL
  Filled 2012-06-15 (×7): qty 1

## 2012-06-15 MED ORDER — DIPHENHYDRAMINE HCL 25 MG PO CAPS: 25.0000 mg | ORAL_CAPSULE | Freq: Four times a day (QID) | ORAL | Status: DC | PRN

## 2012-06-15 MED ORDER — PRENATAL MULTIVITAMIN CH: 1.0000 | ORAL_TABLET | Freq: Every day | ORAL | Status: DC

## 2012-06-15 MED ORDER — ONDANSETRON HCL 4 MG PO TABS: 4.0000 mg | ORAL_TABLET | ORAL | Status: DC | PRN

## 2012-06-15 MED ORDER — PRENATAL MULTIVITAMIN CH
1.0000 | ORAL_TABLET | Freq: Every day | ORAL | Status: DC
  Administered 2012-06-15 – 2012-06-17 (×3): 1 via ORAL
  Filled 2012-06-15 (×4): qty 1

## 2012-06-15 MED ORDER — TETANUS-DIPHTH-ACELL PERTUSSIS 5-2.5-18.5 LF-MCG/0.5 IM SUSP: 0.5000 mL | Freq: Once | INTRAMUSCULAR | Status: DC

## 2012-06-15 MED ORDER — LANOLIN HYDROUS EX OINT: TOPICAL_OINTMENT | CUTANEOUS | Status: DC | PRN

## 2012-06-15 MED ORDER — OXYCODONE-ACETAMINOPHEN 5-325 MG PO TABS
1.0000 | ORAL_TABLET | ORAL | Status: DC | PRN
  Administered 2012-06-15 – 2012-06-16 (×5): 1 via ORAL
  Filled 2012-06-15 (×5): qty 1

## 2012-06-15 MED ORDER — SENNOSIDES-DOCUSATE SODIUM 8.6-50 MG PO TABS
2.0000 | ORAL_TABLET | Freq: Every day | ORAL | Status: DC
  Administered 2012-06-15 – 2012-06-16 (×2): 2 via ORAL

## 2012-06-15 MED ORDER — SIMETHICONE 80 MG PO CHEW: 80.0000 mg | CHEWABLE_TABLET | ORAL | Status: DC | PRN

## 2012-06-15 MED ORDER — WITCH HAZEL-GLYCERIN EX PADS: 1.0000 "application " | MEDICATED_PAD | CUTANEOUS | Status: DC | PRN

## 2012-06-15 MED ORDER — TERBUTALINE SULFATE 1 MG/ML IJ SOLN: 0.2500 mg | Freq: Once | INTRAMUSCULAR | Status: DC | PRN

## 2012-06-15 MED ORDER — LACTATED RINGERS IV SOLN: INTRAVENOUS | Status: DC

## 2012-06-15 MED ORDER — ZOLPIDEM TARTRATE 5 MG PO TABS: 5.0000 mg | ORAL_TABLET | Freq: Every evening | ORAL | Status: DC | PRN

## 2012-06-15 NOTE — Anesthesia Postprocedure Evaluation (Signed)
  Anesthesia Post-op Note  Patient: Kelli Barnes  Procedure(s) Performed: * No procedures listed *  Patient Location: Mother/Baby  Anesthesia Type: Epidural  Level of Consciousness: awake, alert  and oriented  Airway and Oxygen Therapy: Patient Spontanous Breathing  Post-op Pain: none  Post-op Assessment: Post-op Vital signs reviewed, Patient's Cardiovascular Status Stable, No headache, No backache, No residual numbness and No residual motor weakness  Post-op Vital Signs: Reviewed and stable  Complications: No apparent anesthesia complications

## 2012-06-15 NOTE — Progress Notes (Signed)
Patient ID: Kelli Barnes, female   DOB: 01/03/1986, 26 y.o.   MRN: [id number] H&P entered 7/28, unable to complete note.  Pt came to hospital in labor. AFVSS SROM at 7:30, clear fluid 10/100/+1  Will start pushing. Expect SVD

## 2012-06-15 NOTE — Progress Notes (Signed)
Dr Ellyn Hack called for update on pt status, informed of sve, fhr , uc pattern, pitocin mmu, she will be in shortly to see pt. No new orders received.

## 2012-06-15 NOTE — H&P (Addendum)
Kelli Barnes is a 26 y.o. female G3 P0020 at 63+ for iol secondary to postdates.  +FM, no LOF, no VB, occ ctx, uncomplicated pnc except hyperemesis.  gbbs neg.  D/w pt r/b/a Maternal Medical History:  Fetal activity: Perceived fetal activity is normal.      OB History    Grav Para Term Preterm Abortions TAB SAB Ect Mult Living   3 0 0 0 2 0 2 0 0 0     G1&2 SAB, G3 present, +abn pap - LGSIL with pregnancy, nl colpo, no STDs Past Medical History  Diagnosis Date  . Asthma   . Depression   . Normal pregnancy 06/14/2012   Past Surgical History  Procedure Date  . Tonsillectomy   . Breast surgery    Family History: DM, COPD, Lung Dz, Lung CA, throat CA. Social History:  reports that she has been smoking Cigarettes.  She has been smoking about .25 packs per day. She has never used smokeless tobacco. She reports that she uses illicit drugs (Marijuana). She reports that she does not drink alcohol.single  Meds PNV All Latex, Lamictal   Prenatal Transfer Tool  Maternal Diabetes: Noneg Genetic Screening: NormalWNL Maternal Ultrasounds/Referrals: Normalnone Fetal Ultrasounds or other Referrals:  Nonenone Maternal Substance Abuse:  Notob, MJ Significant Maternal Medications:  Noneneg Significant Maternal Lab Results:  Lab values include: Group B Strep negativegbbs neg Other Comments:  Nonenone  Review of Systems  Constitutional: Negative.   HENT: Negative.   Eyes: Negative.   Respiratory: Negative.   Cardiovascular: Negative.   Gastrointestinal: Negative.   Genitourinary: Negative.   Musculoskeletal: Negative.   Skin: Negative.   Neurological: Negative.   Psychiatric/Behavioral: Negative.     Dilation: 10 Effacement (%): 100 Station: +1 Exam by:: Kelli Cheek RN Blood pressure 113/74, pulse 85, temperature 98.9 F (37.2 C), temperature source Oral, resp. rate 20, height 5\' 8"  (1.727 m), weight 78.926 kg (174 lb), last menstrual period 08/25/2011, SpO2 97.00%. Maternal  Exam:  Abdomen: Fundal height is appropriate for gestation.   Estimated fetal weight is 7#.   Fetal presentation: vertex  Pelvis: adequate for delivery.      Physical Exam  Constitutional: She is oriented to person, place, and time. She appears well-developed and well-nourished.  HENT:  Head: Normocephalic and atraumatic.  Eyes: Pupils are equal, round, and reactive to light.  Neck: Normal range of motion. Neck supple. No thyromegaly present.  Cardiovascular: Normal rate and regular rhythm.   Respiratory: Effort normal and breath sounds normal. No respiratory distress.  GI: Soft. Bowel sounds are normal. There is no tenderness.  Musculoskeletal: Normal range of motion.  Neurological: She is alert and oriented to person, place, and time.  Skin: Skin is warm and dry.  Psychiatric: She has a normal mood and affect. Her behavior is normal.    Prenatal labs: ABO, Rh: B/Positive/-- (01/16 0000)B+ Antibody: Negative (01/16 0000)neg Rubella: Immune (01/16 0000)Immune RPR: NON REACTIVE (07/28 1845) NR HBsAg: Negative (01/16 0000) neg HIV: Non-reactive (01/16 0000) neg GBS: Negative (06/28 0000) neg Hgb 11.9/ Pap LGSIL/plt 316K/gc neg/chl neg/first tri screen neg/ AFP neg/CF neg/ glucola 117  [place] EDC 7/26, nl anat/ female/ sm fibroids/ ant plac  Tdap 5/3  Assessment/Plan: 26 yo G3 P0020 at 40+ for iol gbbs neg Cytotec, AROM and pitocin for IOL Epidural for comfort   Kelli Barnes 06/15/2012, 9:21 AM   Pt came in in early labor, rapidly progressed.

## 2012-06-16 LAB — CBC
HCT: 26.7 % — ABNORMAL LOW (ref 36.0–46.0)
MCH: 27.2 pg (ref 26.0–34.0)
MCV: 82.7 fL (ref 78.0–100.0)
Platelets: 188 10*3/uL (ref 150–400)
RDW: 15.5 % (ref 11.5–15.5)

## 2012-06-16 NOTE — Progress Notes (Signed)
Post Partum Day 1 Subjective: no complaints, up ad lib, tolerating PO and nl lochia, pain controlled  Objective: Blood pressure 100/62, pulse 62, temperature 97.7 F (36.5 C), temperature source Oral, resp. rate 18, height 5\' 8"  (1.727 m), weight 78.926 kg (174 lb), last menstrual period 08/25/2011, SpO2 99.00%, unknown if currently breastfeeding.  Physical Exam:  General: alert and no distress Lochia: appropriate Uterine Fundus: firm    Basename 06/16/12 0520 06/14/12 1845  HGB 8.8* 12.3  HCT 26.7* 36.6    Assessment/Plan: Plan for discharge tomorrow, Breastfeeding and Lactation consult.  Routine care.     LOS: 2 days   BOVARD,Octave Montrose 06/16/2012, 8:23 AM

## 2012-06-16 NOTE — Progress Notes (Signed)
UR chart review completed.  

## 2012-06-17 MED ORDER — OXYCODONE-ACETAMINOPHEN 5-325 MG PO TABS: 1.0000 | ORAL_TABLET | Freq: Four times a day (QID) | ORAL | Status: AC | PRN

## 2012-06-17 MED ORDER — IBUPROFEN 800 MG PO TABS: 800.0000 mg | ORAL_TABLET | Freq: Three times a day (TID) | ORAL | Status: AC | PRN

## 2012-06-17 MED ORDER — PRENATAL MULTIVITAMIN CH: 1.0000 | ORAL_TABLET | Freq: Every day | ORAL | Status: DC

## 2012-06-17 MED ORDER — MEDROXYPROGESTERONE ACETATE 150 MG/ML IM SUSP
150.0000 mg | Freq: Once | INTRAMUSCULAR | Status: AC
  Administered 2012-06-17: 150 mg via INTRAMUSCULAR
  Filled 2012-06-17: qty 1

## 2012-06-17 NOTE — Progress Notes (Signed)
Post Partum Day 2 Subjective: no complaints, up ad lib, tolerating PO and nl lochia, pain controlled  Objective: Blood pressure 107/64, pulse 65, temperature 98.1 F (36.7 C), temperature source Oral, resp. rate 18, height 5\' 8"  (1.727 m), weight 78.926 kg (174 lb), last menstrual period 08/25/2011, SpO2 99.00%, unknown if currently breastfeeding.  Physical Exam:  General: alert and no distress Lochia: appropriate Uterine Fundus: firm   Basename 06/16/12 0520 06/14/12 1845  HGB 8.8* 12.3  HCT 26.7* 36.6    Assessment/Plan: Discharge home, Breastfeeding and Lactation consult.  Baby to NICU for bradycardia and arryhtmia.  D/c with motrin/percocet/pnv.  F/u 6 weeks   LOS: 3 days   Barnes,Kelli Torrico 06/17/2012, 8:32 AM

## 2012-06-17 NOTE — Discharge Summary (Signed)
Obstetric Discharge Summary Reason for Admission: induction of labor Prenatal Procedures: none Intrapartum Procedures: spontaneous vaginal delivery Postpartum Procedures: none Complications-Operative and Postpartum: 2nd degree perineal laceration Hemoglobin  Date Value Range Status  06/16/2012 8.8* 12.0 - 15.0 g/dL Final     DELTA CHECK NOTED     REPEATED TO VERIFY     HCT  Date Value Range Status  06/16/2012 26.7* 36.0 - 46.0 % Final    Physical Exam:  General: alert and no distress Lochia: appropriate Uterine Fundus: firm  Discharge Diagnoses: Term Pregnancy-delivered  Discharge Information: Date: 06/17/2012 Activity: pelvic rest Diet: routine Medications: PNV, Ibuprofen and Percocet Condition: stable Instructions: refer to practice specific booklet Discharge to: home Follow-up Information    Follow up with Kelli Barnes,Kelli Weitz, Kelli Barnes. Schedule an appointment as soon as possible for a visit in 6 weeks.   Contact information:   [address] [facility] [address] [place] [address] [phone]          Newborn Data: Live born female  Birth Weight: 6 lb 5.1 oz (2865 g) APGAR: 8, 9  Home with baby in NICU - bradycardia and arrythmia.  Kelli Barnes,Tyce Delcid 06/17/2012, 8:44 AM

## 2012-08-17 ENCOUNTER — Emergency Department (HOSPITAL_COMMUNITY)
Admission: EM | Admit: 2012-08-17 | Discharge: 2012-08-17 | Disposition: A | Discharge status: Home / Self Care | Payer: Medicaid Other | Attending: Emergency Medicine | Admitting: Emergency Medicine

## 2012-08-17 ENCOUNTER — Encounter (HOSPITAL_COMMUNITY): Payer: Self-pay | Admitting: Family Medicine

## 2012-08-17 DIAGNOSIS — K089 Disorder of teeth and supporting structures, unspecified: Secondary | ICD-10-CM | POA: Insufficient documentation

## 2012-08-17 DIAGNOSIS — K0889 Other specified disorders of teeth and supporting structures: Secondary | ICD-10-CM

## 2012-08-17 DIAGNOSIS — F17200 Nicotine dependence, unspecified, uncomplicated: Secondary | ICD-10-CM | POA: Insufficient documentation

## 2012-08-17 DIAGNOSIS — F329 Major depressive disorder, single episode, unspecified: Secondary | ICD-10-CM | POA: Insufficient documentation

## 2012-08-17 DIAGNOSIS — Z888 Allergy status to other drugs, medicaments and biological substances status: Secondary | ICD-10-CM | POA: Insufficient documentation

## 2012-08-17 DIAGNOSIS — J45909 Unspecified asthma, uncomplicated: Secondary | ICD-10-CM | POA: Insufficient documentation

## 2012-08-17 DIAGNOSIS — Z91040 Latex allergy status: Secondary | ICD-10-CM | POA: Insufficient documentation

## 2012-08-17 DIAGNOSIS — F3289 Other specified depressive episodes: Secondary | ICD-10-CM | POA: Insufficient documentation

## 2012-08-17 MED ORDER — HYDROCODONE-ACETAMINOPHEN 5-500 MG PO TABS: 1.0000 | ORAL_TABLET | Freq: Four times a day (QID) | ORAL | Status: DC | PRN

## 2012-08-17 MED ORDER — AMOXICILLIN 500 MG PO CAPS: 500.0000 mg | ORAL_CAPSULE | Freq: Three times a day (TID) | ORAL | Status: DC

## 2012-08-17 NOTE — ED Notes (Signed)
Pt complaining of 3 weeks of bilateral tooth pain.

## 2012-08-17 NOTE — ED Provider Notes (Signed)
History     CSN: [id number]  Arrival date & time 08/17/12  1308   First MD Initiated Contact with Patient 08/17/12 1519      Chief Complaint  Patient presents with  . Dental Pain    (Consider location/radiation/quality/duration/timing/severity/associated sxs/prior treatment) HPI  Patient presents to the emergency department with a dental complaint. Symptoms began 3 weeks ago and continue to worsen. The patient has tried to alleviate pain with ibuprofen.  Pain rated at a 10/10, characterized as throbbing in nature and located bilateral lower molars. She has a 2 months old but is not breast feeding. Patient denies fever, night sweats, chills, difficulty swallowing or opening mouth, SOB, nuchal rigidity or decreased ROM of neck.  Patient does not have a dentist and requests a resource guide at discharge.   Past Medical History  Diagnosis Date  . Asthma   . Depression   . Normal pregnancy 06/14/2012  . SVD (spontaneous vaginal delivery) 06/15/2012    Past Surgical History  Procedure Date  . Tonsillectomy   . Breast surgery     History reviewed. No pertinent family history.  History  Substance Use Topics  . Smoking status: Current Every Day Smoker -- 0.2 packs/day    Types: Cigarettes  . Smokeless tobacco: Never Used  . Alcohol Use: No    OB History    Grav Para Term Preterm Abortions TAB SAB Ect Mult Living   3 1 1  0 2 0 2 0 0 1      Review of Systems  Review of Systems  Gen: no weight loss, fevers, chills, night sweats  Eyes: no discharge or drainage, no occular pain or visual changes  Nose: no epistaxis or rhinorrhea  Mouth: + dental pain, no sore throat  Neck: no neck pain  Lungs:No wheezing, coughing or hemoptysis CV: no chest pain, palpitations, dependent edema or orthopnea  Abd: no abdominal pain, nausea, vomiting  GU: no dysuria or gross hematuria  MSK:  No abnormalities  Neuro: no headache, no focal neurologic deficits  Skin: no abnormalities Psyche:  negative.   Allergies  Latex and Lamictal  Home Medications   Current Outpatient Rx  Name Route Sig Dispense Refill  . IBUPROFEN 800 MG PO TABS Oral Take 800 mg by mouth every 8 (eight) hours as needed. For pain    . PRENATAL MULTIVITAMIN CH Oral Take 1 tablet by mouth daily. 30 tablet 12    BP 123/73  Pulse 91  Temp 98.1 F (36.7 C) (Oral)  Resp 18  Ht 5' 8.5" (1.74 m)  Wt 165 lb (74.844 kg)  BMI 24.72 kg/m2  SpO2 98%  Physical Exam  Constitutional: She appears well-developed and well-nourished. No distress.  HENT:  Head: Normocephalic and atraumatic.  Mouth/Throat: Uvula is midline, oropharynx is clear and moist and mucous membranes are normal. Normal dentition. Dental caries (Pts tooth shows no obvious abscess but moderate to severe tenderness to palpation of marked tooth) present. No uvula swelling.    Eyes: Pupils are equal, round, and reactive to light.  Neck: Trachea normal, normal range of motion and full passive range of motion without pain. Neck supple.  Cardiovascular: Normal rate, regular rhythm, normal heart sounds and normal pulses.   Pulmonary/Chest: Effort normal and breath sounds normal. No respiratory distress. Chest wall is not dull to percussion. She exhibits no tenderness, no crepitus, no edema, no deformity and no retraction.  Abdominal: Normal appearance.  Musculoskeletal: Normal range of motion.  Neurological: She is alert. She  has normal strength.  Skin: Skin is warm, dry and intact. She is not diaphoretic.  Psychiatric: She has a normal mood and affect. Her speech is normal. Cognition and memory are normal.    ED Course  Procedures (including critical care time)  Labs Reviewed - No data to display No results found.   1. Pain, dental       MDM  Pt given Rx for Vicodin(10 tabs) and Amoxicillin. Patient informed that they need to find a dentist and have the tooth pulled or the symptoms may be reoccurring. I have discussed that extreme  caution needs to be taken regarding pain medications and her infant. A Resource guide has been given with dental providers. Patient has been given return to ED precautions.         Dorthula Matas, PA 08/17/12 [phone]

## 2012-08-17 NOTE — ED Provider Notes (Signed)
Medical screening examination/treatment/procedure(s) were performed by non-physician practitioner and as supervising physician I was immediately available for consultation/collaboration.  Cheri Guppy, MD 08/17/12 1650

## 2013-01-05 ENCOUNTER — Encounter (HOSPITAL_COMMUNITY): Payer: Self-pay | Admitting: Emergency Medicine

## 2013-01-05 ENCOUNTER — Emergency Department (HOSPITAL_COMMUNITY)
Admission: EM | Admit: 2013-01-05 | Discharge: 2013-01-05 | Disposition: A | Discharge status: Home / Self Care | Payer: Self-pay | Attending: Emergency Medicine | Admitting: Emergency Medicine

## 2013-01-05 DIAGNOSIS — J45909 Unspecified asthma, uncomplicated: Secondary | ICD-10-CM | POA: Insufficient documentation

## 2013-01-05 DIAGNOSIS — K1370 Unspecified lesions of oral mucosa: Secondary | ICD-10-CM | POA: Insufficient documentation

## 2013-01-05 DIAGNOSIS — K047 Periapical abscess without sinus: Secondary | ICD-10-CM | POA: Insufficient documentation

## 2013-01-05 DIAGNOSIS — R220 Localized swelling, mass and lump, head: Secondary | ICD-10-CM | POA: Insufficient documentation

## 2013-01-05 DIAGNOSIS — F17200 Nicotine dependence, unspecified, uncomplicated: Secondary | ICD-10-CM | POA: Insufficient documentation

## 2013-01-05 DIAGNOSIS — Z8659 Personal history of other mental and behavioral disorders: Secondary | ICD-10-CM | POA: Insufficient documentation

## 2013-01-05 DIAGNOSIS — F319 Bipolar disorder, unspecified: Secondary | ICD-10-CM | POA: Insufficient documentation

## 2013-01-05 HISTORY — DX: Bipolar disorder, unspecified: F31.9

## 2013-01-05 MED ORDER — PENICILLIN V POTASSIUM 250 MG PO TABS
500.0000 mg | ORAL_TABLET | Freq: Once | ORAL | Status: AC
  Administered 2013-01-05: 500 mg via ORAL
  Filled 2013-01-05: qty 2

## 2013-01-05 MED ORDER — HYDROCODONE-ACETAMINOPHEN 5-325 MG PO TABS: 1.0000 | ORAL_TABLET | ORAL | Status: DC | PRN

## 2013-01-05 MED ORDER — PENICILLIN V POTASSIUM 500 MG PO TABS: 500.0000 mg | ORAL_TABLET | Freq: Three times a day (TID) | ORAL | Status: DC

## 2013-01-05 MED ORDER — HYDROCODONE-ACETAMINOPHEN 5-325 MG PO TABS
1.0000 | ORAL_TABLET | Freq: Once | ORAL | Status: AC
  Administered 2013-01-05: 1 via ORAL
  Filled 2013-01-05: qty 1

## 2013-01-05 NOTE — ED Provider Notes (Signed)
Medical screening examination/treatment/procedure(s) were performed by non-physician practitioner and as supervising physician I was immediately available for consultation/collaboration.   Carleene Cooper III, MD 01/05/13 [phone]

## 2013-01-05 NOTE — ED Notes (Signed)
Pt c/o right dental pain x 2 days with swelling

## 2013-01-05 NOTE — ED Provider Notes (Signed)
History     CSN: [id number]  Arrival date & time 01/05/13  1143   First MD Initiated Contact with Patient 01/05/13 1245      Chief Complaint  Patient presents with  . Dental Pain    (Consider location/radiation/quality/duration/timing/severity/associated sxs/prior treatment) Patient is a 27 y.o. female presenting with tooth pain. The history is provided by the patient.  Dental PainThe primary symptoms include mouth pain. Primary symptoms do not include fever or sore throat. The symptoms began yesterday.  Additional symptoms include: dental sensitivity to temperature, gum swelling and facial swelling. Additional symptoms do not include: trouble swallowing.    Past Medical History  Diagnosis Date  . Asthma   . Depression   . Normal pregnancy 06/14/2012  . SVD (spontaneous vaginal delivery) 06/15/2012  . Bipolar disorder     Past Surgical History  Procedure Laterality Date  . Tonsillectomy    . Breast surgery      History reviewed. No pertinent family history.  History  Substance Use Topics  . Smoking status: Current Every Day Smoker -- 0.25 packs/day    Types: Cigarettes  . Smokeless tobacco: Never Used  . Alcohol Use: No    OB History   Grav Para Term Preterm Abortions TAB SAB Ect Mult Living   3 1 1  0 2 0 2 0 0 1      Review of Systems  Constitutional: Negative for fever.  HENT: Positive for facial swelling and dental problem. Negative for sore throat and trouble swallowing.   Gastrointestinal: Negative for nausea.    Allergies  Latex and Lamictal  Home Medications   Current Outpatient Rx  Name  Route  Sig  Dispense  Refill  . Multiple Vitamins-Minerals (MULTIVITAMINS THER. W/MINERALS) TABS   Oral   Take 1 tablet by mouth daily.           BP 142/94  Pulse 100  Temp(Src) 98.7 F (37.1 C)  Resp 20  SpO2 96%  Physical Exam  Constitutional: She is oriented to person, place, and time. She appears well-developed and well-nourished.  HENT:   Generally good dentition. Lower right jaw swollen, tender. No pointing abscess to gums.  Neck: Normal range of motion.  Pulmonary/Chest: Effort normal.  Lymphadenopathy:    She has cervical adenopathy.  Neurological: She is alert and oriented to person, place, and time.  Skin: Skin is warm and dry.  Psychiatric: She has a normal mood and affect.    ED Course  Procedures (including critical care time)  Labs Reviewed - No data to display No results found.   No diagnosis found. 1. Dental abscess   MDM  Uncomplicated dental abscess.         Arnoldo Hooker, PA-C 01/05/13 1331

## 2013-03-02 ENCOUNTER — Encounter (HOSPITAL_COMMUNITY): Payer: Self-pay | Admitting: *Deleted

## 2013-03-02 ENCOUNTER — Emergency Department (HOSPITAL_COMMUNITY)
Admission: EM | Admit: 2013-03-02 | Discharge: 2013-03-02 | Disposition: A | Discharge status: Home / Self Care | Payer: Self-pay | Attending: Emergency Medicine | Admitting: Emergency Medicine

## 2013-03-02 DIAGNOSIS — Z8659 Personal history of other mental and behavioral disorders: Secondary | ICD-10-CM | POA: Insufficient documentation

## 2013-03-02 DIAGNOSIS — J45909 Unspecified asthma, uncomplicated: Secondary | ICD-10-CM | POA: Insufficient documentation

## 2013-03-02 DIAGNOSIS — K047 Periapical abscess without sinus: Secondary | ICD-10-CM | POA: Insufficient documentation

## 2013-03-02 DIAGNOSIS — F17200 Nicotine dependence, unspecified, uncomplicated: Secondary | ICD-10-CM | POA: Insufficient documentation

## 2013-03-02 MED ORDER — HYDROCODONE-ACETAMINOPHEN 5-325 MG PO TABS: 1.0000 | ORAL_TABLET | ORAL | Status: DC | PRN

## 2013-03-02 MED ORDER — PENICILLIN V POTASSIUM 500 MG PO TABS: 500.0000 mg | ORAL_TABLET | Freq: Four times a day (QID) | ORAL | Status: AC

## 2013-03-02 NOTE — ED Provider Notes (Signed)
History     CSN: [id number]  Arrival date & time 03/02/13  1142   First MD Initiated Contact with Patient 03/02/13 1224      Chief Complaint  Patient presents with  . Dental Pain    (Consider location/radiation/quality/duration/timing/severity/associated sxs/prior treatment) HPI Comments: Patient presents with right lower molar pain and adjacent swelling.  Pt states the molar has been broken for a long time, has previously had an abscess associated with this tooth but never saw a dentist.  States the swelling began yesterday and the pain began today.  Pain is throbbing, worse with eating, drinking, air moving past it.  Denies fevers, difficulty swallowing or breathing.   Patient is a 27 y.o. female presenting with tooth pain. The history is provided by the patient.  Dental PainPrimary symptoms do not include fever, shortness of breath or sore throat.  Additional symptoms do not include: trouble swallowing.    Past Medical History  Diagnosis Date  . Asthma   . Depression   . Normal pregnancy 06/14/2012  . SVD (spontaneous vaginal delivery) 06/15/2012  . Bipolar disorder     Past Surgical History  Procedure Laterality Date  . Tonsillectomy    . Breast surgery      No family history on file.  History  Substance Use Topics  . Smoking status: Current Every Day Smoker -- 0.25 packs/day    Types: Cigarettes  . Smokeless tobacco: Never Used  . Alcohol Use: No    OB History   Grav Para Term Preterm Abortions TAB SAB Ect Mult Living   3 1 1  0 2 0 2 0 0 1      Review of Systems  Constitutional: Negative for fever and chills.  HENT: Positive for dental problem. Negative for sore throat and trouble swallowing.   Respiratory: Negative for shortness of breath.     Allergies  Latex and Lamictal  Home Medications   Current Outpatient Rx  Name  Route  Sig  Dispense  Refill  . Multiple Vitamins-Minerals (MULTIVITAMINS THER. W/MINERALS) TABS   Oral   Take 1 tablet by  mouth daily.         Marland Kitchen HYDROcodone-acetaminophen (NORCO/VICODIN) 5-325 MG per tablet   Oral   Take 1 tablet by mouth every 4 (four) hours as needed for pain.   15 tablet   0   . penicillin v potassium (VEETID) 500 MG tablet   Oral   Take 1 tablet (500 mg total) by mouth 4 (four) times daily.   40 tablet   0     BP 119/81  Pulse 103  Temp(Src) 98.7 F (37.1 C) (Oral)  Resp 16  SpO2 95%  Physical Exam  Nursing note and vitals reviewed. Constitutional: She appears well-developed and well-nourished. No distress.  HENT:  Head: Normocephalic and atraumatic.    Mouth/Throat:    Neck: Neck supple.  Pulmonary/Chest: Effort normal.  Neurological: She is alert.  Skin: She is not diaphoretic.    ED Course  Procedures (including critical care time)  Labs Reviewed - No data to display No results found.   1. Dental abscess     MDM  Afebrile, nontoxic patient with dental abscess.  No sore throat, no airway concerns.  No paratracheal tenderness.  Doubt deep space infection such as ludwig's angina.  Pt d/c home with penicillin, dental follow up, pain medication.  Discussed diagnosis, treatment plan, follow up with patient.  Pt given return precautions.  Pt verbalizes understanding and agrees with  plan.          Trixie Dredge, PA-C 03/02/13 1547

## 2013-03-02 NOTE — ED Notes (Signed)
Pt with broken L lower molar x 1 year.  Was seen here approx 2 months ago for infection to same tooth.  Pt here for increasing R lower jaw pain to same.

## 2013-03-03 NOTE — ED Provider Notes (Signed)
Medical screening examination/treatment/procedure(s) were performed by non-physician practitioner and as supervising physician I was immediately available for consultation/collaboration.  Jerzie Bieri T Starlena Beil, MD 03/03/13 0748 

## 2013-09-22 ENCOUNTER — Encounter (HOSPITAL_COMMUNITY): Payer: Self-pay | Admitting: Emergency Medicine

## 2013-09-22 ENCOUNTER — Emergency Department (HOSPITAL_COMMUNITY)
Admission: EM | Admit: 2013-09-22 | Discharge: 2013-09-22 | Disposition: A | Discharge status: Home / Self Care | Payer: Medicaid Other | Attending: Emergency Medicine | Admitting: Emergency Medicine

## 2013-09-22 DIAGNOSIS — K0889 Other specified disorders of teeth and supporting structures: Secondary | ICD-10-CM

## 2013-09-22 DIAGNOSIS — F17200 Nicotine dependence, unspecified, uncomplicated: Secondary | ICD-10-CM | POA: Insufficient documentation

## 2013-09-22 DIAGNOSIS — J45909 Unspecified asthma, uncomplicated: Secondary | ICD-10-CM | POA: Insufficient documentation

## 2013-09-22 DIAGNOSIS — Z91040 Latex allergy status: Secondary | ICD-10-CM | POA: Insufficient documentation

## 2013-09-22 DIAGNOSIS — Z8659 Personal history of other mental and behavioral disorders: Secondary | ICD-10-CM | POA: Insufficient documentation

## 2013-09-22 DIAGNOSIS — K089 Disorder of teeth and supporting structures, unspecified: Secondary | ICD-10-CM | POA: Insufficient documentation

## 2013-09-22 MED ORDER — PROMETHAZINE HCL 25 MG PO TABS: 25.0000 mg | ORAL_TABLET | Freq: Four times a day (QID) | ORAL | Status: DC | PRN

## 2013-09-22 MED ORDER — OXYCODONE-ACETAMINOPHEN 5-325 MG PO TABS: 2.0000 | ORAL_TABLET | Freq: Four times a day (QID) | ORAL | Status: DC | PRN

## 2013-09-22 MED ORDER — OXYCODONE-ACETAMINOPHEN 5-325 MG PO TABS
2.0000 | ORAL_TABLET | Freq: Once | ORAL | Status: AC
  Administered 2013-09-22: 2 via ORAL
  Filled 2013-09-22: qty 2

## 2013-09-22 MED ORDER — ONDANSETRON 4 MG PO TBDP
8.0000 mg | ORAL_TABLET | Freq: Once | ORAL | Status: AC
  Administered 2013-09-22: 8 mg via ORAL
  Filled 2013-09-22: qty 2

## 2013-09-22 MED ORDER — PENICILLIN V POTASSIUM 500 MG PO TABS: 500.0000 mg | ORAL_TABLET | Freq: Four times a day (QID) | ORAL | Status: DC

## 2013-09-22 NOTE — ED Notes (Signed)
C/o  L lower dental pain x 1 year that has gotten worse over the last 2 days with swelling.

## 2013-09-22 NOTE — ED Provider Notes (Signed)
CSN: [id number]     Arrival date & time 09/22/13  1929 History  This chart was scribed for Junious Silk, non-physician practitioner, working with Flint Melter, MD by Bennett Scrape, ED Scribe. This patient was seen in room TR09C/TR09C and the patient's care was started at 8:08 PM.    Chief Complaint  Patient presents with  . Dental Pain   The history is provided by the patient. No language interpreter was used.   HPI Comments: Kelli Barnes is a 27 y.o. female who presents to the Emergency Department complaining of persistent left lower dental pain for the past year that has worsened over the past 2 days with associated left facial swelling. She describes the pain as sharp and stabbing into the tooth and pressure-like along the left jaw. She reports that the pain is worsened with heat and cold. She has been taking Naprosyn at home with relief at first but not currently. She reports prior episodes of the same attributed to dental abscesses which caused her concern. She denies any fevers.    Past Medical History  Diagnosis Date  . Asthma   . Depression   . Normal pregnancy 06/14/2012  . SVD (spontaneous vaginal delivery) 06/15/2012  . Bipolar disorder    Past Surgical History  Procedure Laterality Date  . Tonsillectomy    . Breast surgery     No family history on file. History  Substance Use Topics  . Smoking status: Current Every Day Smoker -- 0.25 packs/day    Types: Cigarettes  . Smokeless tobacco: Never Used  . Alcohol Use: No   OB History   Grav Para Term Preterm Abortions TAB SAB Ect Mult Living   3 1 1  0 2 0 2 0 0 1     Review of Systems  Constitutional: Negative for fever.  HENT: Positive for dental problem and facial swelling. Negative for trouble swallowing.   All other systems reviewed and are negative.    Allergies  Latex and Lamictal  Home Medications   Current Outpatient Rx  Name  Route  Sig  Dispense  Refill  . HYDROcodone-acetaminophen  (NORCO/VICODIN) 5-325 MG per tablet   Oral   Take 1 tablet by mouth every 4 (four) hours as needed for pain.   15 tablet   0   . Multiple Vitamins-Minerals (MULTIVITAMINS THER. W/MINERALS) TABS   Oral   Take 1 tablet by mouth daily.          Triage Vitals: BP 134/76  Pulse 83  Temp(Src) 98.6 F (37 C) (Oral)  Resp 16  Ht 5\' 9"  (1.753 m)  Wt 183 lb 8 oz (83.235 kg)  BMI 27.09 kg/m2  SpO2 98%  LMP 09/01/2013  Physical Exam  Nursing note and vitals reviewed. Constitutional: She is oriented to person, place, and time. She appears well-developed and well-nourished. No distress.  HENT:  Head: Normocephalic and atraumatic.  Right Ear: External ear normal.  Left Ear: External ear normal.  Nose: Nose normal.  Mouth/Throat: Oropharynx is clear and moist.  No trismus, submental edema or tongue elevation. No drainable abscess. Very poor dentition.    Eyes: Conjunctivae are normal.  Neck: Normal range of motion.  Cardiovascular: Normal rate, regular rhythm and normal heart sounds.   Pulmonary/Chest: Effort normal and breath sounds normal. No stridor. No respiratory distress. She has no wheezes. She has no rales.  Musculoskeletal: Normal range of motion.  Neurological: She is alert and oriented to person, place, and time. She has  normal strength.  Skin: Skin is warm and dry. She is not diaphoretic. No erythema.  Psychiatric: She has a normal mood and affect. Her behavior is normal.    ED Course  Procedures (including critical care time)  DIAGNOSTIC STUDIES: Oxygen Saturation is 98% on room air, normal by my interpretation.    COORDINATION OF CARE: 8:11 PM-Discussed discharge plan which includes antibiotics and pain medication with pt and pt agreed to plan. Pt has a ride home. Also advised pt to follow up with dental referral as needed and pt agreed. Addressed symptoms to return for with pt.   Labs Review Labs Reviewed - No data to display Imaging Review No results  found.  EKG Interpretation   None       MDM   1. Pain, dental    Patient with toothache.  No gross abscess.  Exam unconcerning for Ludwig's angina or spread of infection.  Will treat with penicillin and pain medicine.  Urged patient to follow-up with dentist.    I personally performed the services described in this documentation, which was scribed in my presence. The recorded information has been reviewed and is accurate.     Mora Bellman, PA-C 09/22/13 2028

## 2013-09-23 NOTE — ED Provider Notes (Signed)
Medical screening examination/treatment/procedure(s) were performed by non-physician practitioner and as supervising physician I was immediately available for consultation/collaboration.  Flint Melter, MD 09/23/13 [phone]

## 2014-09-19 ENCOUNTER — Encounter (HOSPITAL_COMMUNITY): Payer: Self-pay | Admitting: Emergency Medicine

## 2014-11-09 ENCOUNTER — Emergency Department (HOSPITAL_COMMUNITY)
Admission: EM | Admit: 2014-11-09 | Discharge: 2014-11-09 | Disposition: A | Discharge status: Home / Self Care | Payer: Self-pay | Source: Home / Self Care | Attending: Emergency Medicine | Admitting: Emergency Medicine

## 2014-11-09 ENCOUNTER — Encounter (HOSPITAL_COMMUNITY): Payer: Self-pay | Admitting: Emergency Medicine

## 2014-11-09 DIAGNOSIS — J029 Acute pharyngitis, unspecified: Secondary | ICD-10-CM

## 2014-11-09 DIAGNOSIS — J069 Acute upper respiratory infection, unspecified: Secondary | ICD-10-CM

## 2014-11-09 LAB — POCT RAPID STREP A: Streptococcus, Group A Screen (Direct): NEGATIVE

## 2014-11-09 NOTE — ED Provider Notes (Signed)
CSN: [id number]     Arrival date & time 11/09/14  1039 History   First MD Initiated Contact with Patient 11/09/14 1113     Chief Complaint  Patient presents with  . 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 female presenting with pharyngitis. The history is provided by the patient.  Sore Throat This is a new problem. Episode onset: 5-7 days. The problem occurs constantly. The problem has not changed since onset.Associated symptoms comments: +subjuective fever, nasal congestion and dry cough.    Past Medical History  Diagnosis Date  . Asthma   . Depression   . Normal pregnancy 06/14/2012  . SVD (spontaneous vaginal delivery) 06/15/2012  . Bipolar disorder    Past Surgical History  Procedure Laterality Date  . Tonsillectomy    . Breast surgery     No family history on file. History  Substance Use Topics  . Smoking status: Current Every Day Smoker -- 0.50 packs/day    Types: Cigarettes  . Smokeless tobacco: Never Used  . Alcohol Use: No   OB History    Gravida Para Term Preterm AB TAB SAB Ectopic Multiple Living   3 1 1  0 2 0 2 0 0 1     Review of Systems  All other systems reviewed and are negative.   Allergies  Latex and Lamictal  Home Medications   Prior to Admission medications   Medication Sig Start Date End Date Taking? Authorizing Provider  NAPROXEN PO Take 1 tablet by mouth daily as needed (pain).    Historical Provider, MD  oxyCODONE-acetaminophen (PERCOCET/ROXICET) 5-325 MG per tablet Take 2 tablets by mouth every 6 (six) hours as needed for severe pain. 09/22/13   Mora BellmanHannah S Merrell, PA-C  penicillin v potassium (VEETID) 500 MG tablet Take 1 tablet (500 mg total) by mouth 4 (four) times daily. 09/22/13   Mora BellmanHannah S Merrell, PA-C  promethazine (PHENERGAN) 25 MG tablet Take 1 tablet (25 mg total) by mouth every 6 (six) hours  as needed for nausea. 11/01/11 11/08/11  Adam PhenixJames G Arnold, MD  promethazine (PHENERGAN) 25 MG tablet Take 1 tablet (25 mg total) by mouth every 6 (six) hours as needed for nausea or vomiting. 09/22/13   Mora BellmanHannah S Merrell, PA-C   LMP 10/15/2014 Physical Exam  Constitutional: She is oriented to person, place, and time. She appears well-developed and well-nourished.  HENT:  Head: Normocephalic and atraumatic.  Right Ear: Hearing, tympanic membrane, external ear and ear canal normal.  Left Ear: Hearing, tympanic membrane, external ear and ear canal normal.  Nose: Nose normal.  Mouth/Throat: Uvula is midline and mucous membranes are normal. Oral lesions present. No trismus in the jaw. Posterior oropharyngeal erythema present. No oropharyngeal exudate, posterior oropharyngeal edema or tonsillar abscesses.  Few small scattered shallow ulcers on uvula Uvula with erythema and trace STS  Eyes: Conjunctivae are normal. No scleral icterus.  Neck: Normal range of motion. Neck supple.  Cardiovascular: Normal rate, regular rhythm and normal heart sounds.   Pulmonary/Chest: Effort normal and breath sounds normal. No stridor.  Musculoskeletal: Normal range of motion.  Lymphadenopathy:    She has no cervical adenopathy.  Neurological: She is alert and oriented to person, place, and time.  Skin: Skin is warm and dry.  Psychiatric: She has a normal mood and affect. Her behavior is normal.  Nursing note and vitals reviewed.   ED Course  Procedures (including critical  care time) Labs Review Labs Reviewed  POCT RAPID STREP A ([facility] CARE ONLY)    Imaging Review No results found.   MDM   1. Pharyngitis   2. URI (upper respiratory infection)     Rapid strep negative  Small ulcerative lesions at uvula suggest viral pharyngitis Will send throat swab for C&S and advise patient if results indicate need for additional treatment, will notify by phone.  Symptomatic care at home Tylenol, Ibuprofen,  gargles    Ria ClockJennifer Lee H Elliott Lasecki, [place]PA 11/09/14 1201

## 2014-11-09 NOTE — ED Notes (Signed)
C/o ST onset 1 week Sx include: Dry cough, congestion, intermittent fevers Taking OTC cold meds w/no relief Alert, no signs of acute distress.

## 2014-11-09 NOTE — Discharge Instructions (Signed)
Pharyngitis °Pharyngitis is redness, pain, and swelling (inflammation) of your pharynx.  °CAUSES  °Pharyngitis is usually caused by infection. Most of the time, these infections are from viruses (viral) and are part of a cold. However, sometimes pharyngitis is caused by bacteria (bacterial). Pharyngitis can also be caused by allergies. Viral pharyngitis may be spread from person to person by coughing, sneezing, and personal items or utensils (cups, forks, spoons, toothbrushes). Bacterial pharyngitis may be spread from person to person by more intimate contact, such as kissing.  °SIGNS AND SYMPTOMS  °Symptoms of pharyngitis include:   °· Sore throat.   °· Tiredness (fatigue).   °· Low-grade fever.   °· Headache. °· Joint pain and muscle aches. °· Skin rashes. °· Swollen lymph nodes. °· Plaque-like film on throat or tonsils (often seen with bacterial pharyngitis). °DIAGNOSIS  °Your health care provider will ask you questions about your illness and your symptoms. Your medical history, along with a physical exam, is often all that is needed to diagnose pharyngitis. Sometimes, a rapid strep test is done. Other lab tests may also be done, depending on the suspected cause.  °TREATMENT  °Viral pharyngitis will usually get better in 3-4 days without the use of medicine. Bacterial pharyngitis is treated with medicines that kill germs (antibiotics).  °HOME CARE INSTRUCTIONS  °· Drink enough water and fluids to keep your urine clear or pale yellow.   °· Only take over-the-counter or prescription medicines as directed by your health care provider:   °· If you are prescribed antibiotics, make sure you finish them even if you start to feel better.   °· Do not take aspirin.   °· Get lots of rest.   °· Gargle with 8 oz of salt water (½ tsp of salt per 1 qt of water) as often as every 1-2 hours to soothe your throat.   °· Throat lozenges (if you are not at risk for choking) or sprays may be used to soothe your throat. °SEEK MEDICAL  CARE IF:  °· You have large, tender lumps in your neck. °· You have a rash. °· You cough up green, yellow-brown, or bloody spit. °SEEK IMMEDIATE MEDICAL CARE IF:  °· Your neck becomes stiff. °· You drool or are unable to swallow liquids. °· You vomit or are unable to keep medicines or liquids down. °· You have severe pain that does not go away with the use of recommended medicines. °· You have trouble breathing (not caused by a stuffy nose). °MAKE SURE YOU:  °· Understand these instructions. °· Will watch your condition. °· Will get help right away if you are not doing well or get worse. °Document Released: 11/04/2005 Document Revised: 08/25/2013 Document Reviewed: 07/12/2013 °ExitCare® Patient Information ©2015 [employer]. This information is not intended to replace advice given to you by your health care provider. Make sure you discuss any questions you have with your health care provider. ° °Salt Water Gargle °This solution will help make your mouth and throat feel better. °HOME CARE INSTRUCTIONS  °· Mix 1 teaspoon of salt in 8 ounces of warm water. °· Gargle with this solution as much or often as you need or as directed. Swish and gargle gently if you have any sores or wounds in your mouth. °· Do not swallow this mixture. °Document Released: 08/08/2004 Document Revised: 01/27/2012 Document Reviewed: 12/30/2008 °ExitCare® Patient Information ©2015 [employer]. This information is not intended to replace advice given to you by your health care provider. Make sure you discuss any questions you have with your health care provider. ° °  Sore Throat °A sore throat is pain, burning, irritation, or scratchiness of the throat. There is often pain or tenderness when swallowing or talking. A sore throat may be accompanied by other symptoms, such as coughing, sneezing, fever, and swollen neck glands. A sore throat is often the first sign of another sickness, such as a cold, flu, strep throat, or mononucleosis (commonly  known as mono). Most sore throats go away without medical treatment. °CAUSES  °The most common causes of a sore throat include: °· A viral infection, such as a cold, flu, or mono. °· A bacterial infection, such as strep throat, tonsillitis, or whooping cough. °· Seasonal allergies. °· Dryness in the air. °· Irritants, such as smoke or pollution. °· Gastroesophageal reflux disease (GERD). °HOME CARE INSTRUCTIONS  °· Only take over-the-counter medicines as directed by your caregiver. °· Drink enough fluids to keep your urine clear or pale yellow. °· Rest as needed. °· Try using throat sprays, lozenges, or sucking on hard candy to ease any pain (if older than 4 years or as directed). °· Sip warm liquids, such as broth, herbal tea, or warm water with honey to relieve pain temporarily. You may also eat or drink cold or frozen liquids such as frozen ice pops. °· Gargle with salt water (mix 1 tsp salt with 8 oz of water). °· Do not smoke and avoid secondhand smoke. °· Put a cool-mist humidifier in your bedroom at night to moisten the air. You can also turn on a hot shower and sit in the bathroom with the door closed for 5-10 minutes. °SEEK IMMEDIATE MEDICAL CARE IF: °· You have difficulty breathing. °· You are unable to swallow fluids, soft foods, or your saliva. °· You have increased swelling in the throat. °· Your sore throat does not get better in 7 days. °· You have nausea and vomiting. °· You have a fever or persistent symptoms for more than 2-3 days. °· You have a fever and your symptoms suddenly get worse. °MAKE SURE YOU:  °· Understand these instructions. °· Will watch your condition. °· Will get help right away if you are not doing well or get worse. °Document Released: 12/12/2004 Document Revised: 10/21/2012 Document Reviewed: 07/12/2012 °ExitCare® Patient Information ©2015 [employer] LLC. This information is not intended to replace advice given to you by your health care provider. Make sure you discuss any  questions you have with your health care provider. ° °

## 2014-11-11 LAB — CULTURE, GROUP A STREP

## 2015-06-02 ENCOUNTER — Encounter (HOSPITAL_COMMUNITY): Payer: Self-pay | Admitting: Emergency Medicine

## 2015-06-02 ENCOUNTER — Emergency Department (HOSPITAL_COMMUNITY)
Admission: EM | Admit: 2015-06-02 | Discharge: 2015-06-02 | Disposition: A | Discharge status: Home / Self Care | Payer: Self-pay | Attending: Emergency Medicine | Admitting: Emergency Medicine

## 2015-06-02 ENCOUNTER — Emergency Department (HOSPITAL_COMMUNITY): Payer: Self-pay

## 2015-06-02 DIAGNOSIS — Z91040 Latex allergy status: Secondary | ICD-10-CM | POA: Insufficient documentation

## 2015-06-02 DIAGNOSIS — Z792 Long term (current) use of antibiotics: Secondary | ICD-10-CM | POA: Insufficient documentation

## 2015-06-02 DIAGNOSIS — Z3202 Encounter for pregnancy test, result negative: Secondary | ICD-10-CM | POA: Insufficient documentation

## 2015-06-02 DIAGNOSIS — Z8659 Personal history of other mental and behavioral disorders: Secondary | ICD-10-CM | POA: Insufficient documentation

## 2015-06-02 DIAGNOSIS — Z720 Tobacco use: Secondary | ICD-10-CM | POA: Insufficient documentation

## 2015-06-02 DIAGNOSIS — J45909 Unspecified asthma, uncomplicated: Secondary | ICD-10-CM | POA: Insufficient documentation

## 2015-06-02 DIAGNOSIS — L03818 Cellulitis of other sites: Secondary | ICD-10-CM | POA: Insufficient documentation

## 2015-06-02 LAB — BASIC METABOLIC PANEL
Anion gap: 8 (ref 5–15)
BUN: <5 mg/dL — ABNORMAL LOW (ref 6–20)
CHLORIDE: 105 mmol/L (ref 101–111)
CO2: 25 mmol/L (ref 22–32)
Calcium: 9.5 mg/dL (ref 8.9–10.3)
Creatinine, Ser: 0.63 mg/dL (ref 0.44–1.00)
GFR calc Af Amer: >60 mL/min (ref >60)
GLUCOSE: 92 mg/dL (ref 65–99)
POTASSIUM: 4.2 mmol/L (ref 3.5–5.1)
SODIUM: 138 mmol/L (ref 135–145)

## 2015-06-02 LAB — CBC WITH DIFFERENTIAL/PLATELET
Basophils Absolute: 0 10*3/uL (ref 0.0–0.1)
Basophils Relative: 0 % (ref 0–1)
Eosinophils Absolute: 0.1 10*3/uL (ref 0.0–0.7)
Eosinophils Relative: 1 % (ref 0–5)
HCT: 37 % (ref 36.0–46.0)
HEMOGLOBIN: 12.2 g/dL (ref 12.0–15.0)
LYMPHS PCT: 27 % (ref 12–46)
Lymphs Abs: 2.9 10*3/uL (ref 0.7–4.0)
MCH: 26.5 pg (ref 26.0–34.0)
MCHC: 33 g/dL (ref 30.0–36.0)
MCV: 80.4 fL (ref 78.0–100.0)
MONO ABS: 0.9 10*3/uL (ref 0.1–1.0)
Monocytes Relative: 9 % (ref 3–12)
NEUTROS ABS: 6.7 10*3/uL (ref 1.7–7.7)
Neutrophils Relative %: 63 % (ref 43–77)
PLATELETS: 282 10*3/uL (ref 150–400)
RBC: 4.6 MIL/uL (ref 3.87–5.11)
RDW: 13.9 % (ref 11.5–15.5)
WBC: 10.7 10*3/uL — ABNORMAL HIGH (ref 4.0–10.5)

## 2015-06-02 LAB — I-STAT BETA HCG BLOOD, ED (MC, WL, AP ONLY)

## 2015-06-02 MED ORDER — HYDROCODONE-ACETAMINOPHEN 5-325 MG PO TABS: 1.0000 | ORAL_TABLET | Freq: Four times a day (QID) | ORAL | Status: DC | PRN

## 2015-06-02 MED ORDER — DEXAMETHASONE SODIUM PHOSPHATE 10 MG/ML IJ SOLN
10.0000 mg | Freq: Once | INTRAMUSCULAR | Status: AC
  Administered 2015-06-02: 10 mg via INTRAVENOUS
  Filled 2015-06-02: qty 1

## 2015-06-02 MED ORDER — AMOXICILLIN-POT CLAVULANATE 875-125 MG PO TABS: 1.0000 | ORAL_TABLET | Freq: Two times a day (BID) | ORAL | Status: DC

## 2015-06-02 MED ORDER — MORPHINE SULFATE 4 MG/ML IJ SOLN
4.0000 mg | Freq: Once | INTRAMUSCULAR | Status: AC
  Administered 2015-06-02: 4 mg via INTRAVENOUS
  Filled 2015-06-02: qty 1

## 2015-06-02 MED ORDER — IOHEXOL 300 MG/ML  SOLN
75.0000 mL | Freq: Once | INTRAMUSCULAR | Status: AC | PRN
  Administered 2015-06-02: 75 mL via INTRAVENOUS

## 2015-06-02 MED ORDER — ONDANSETRON HCL 4 MG/2ML IJ SOLN
4.0000 mg | Freq: Once | INTRAMUSCULAR | Status: AC
  Administered 2015-06-02: 4 mg via INTRAVENOUS
  Filled 2015-06-02: qty 2

## 2015-06-02 NOTE — ED Provider Notes (Signed)
CSN: [id number]     Arrival date & time 06/02/15  1326 History  This chart was scribed for Roxy Horseman, PA-C, working with Purvis Sheffield, MD by Elon Spanner, ED Scribe. This patient was seen in room TR08C/TR08C and the patient's care was started at 2:08 PM.   No chief complaint on file.  The history is provided by the patient. No language interpreter was used.   HPI Comments: Kelli Barnes is a 29 y.o. female who presents to the Emergency Department complaining of a worsening sore throat only with swallowing onset yesterday with worsening today.  The patient also reports an associated cough onset 8 days ago.  Ibuprofen and Tylenol at home afforded no relief.  She denies previous similar episodes.  She denies fever, vomiting.  Patient is not pregnant.    Past Medical History  Diagnosis Date  . Asthma   . Depression   . Normal pregnancy 06/14/2012  . SVD (spontaneous vaginal delivery) 06/15/2012  . Bipolar disorder    Past Surgical History  Procedure Laterality Date  . Tonsillectomy    . Breast surgery     No family history on file. History  Substance Use Topics  . Smoking status: Current Every Day Smoker -- 0.50 packs/day    Types: Cigarettes  . Smokeless tobacco: Never Used  . Alcohol Use: No   OB History    Gravida Para Term Preterm AB TAB SAB Ectopic Multiple Living   0 2 0 2 0 0 1     Review of Systems  Constitutional: Negative for fever.  HENT: Positive for sore throat.   Respiratory: Positive for cough.   Gastrointestinal: Negative for nausea and vomiting.      Allergies  Latex and Lamictal  Home Medications   Prior to Admission medications   Medication Sig Start Date End Date Taking? Authorizing Provider  NAPROXEN PO Take 1 tablet by mouth daily as needed (pain).    Historical Provider, MD  oxyCODONE-acetaminophen (PERCOCET/ROXICET) 5-325 MG per tablet Take 2 tablets by mouth every 6 (six) hours as needed for severe pain. 09/22/13   Junious Silk, PA-C  penicillin v potassium (VEETID) 500 MG tablet Take 1 tablet (500 mg total) by mouth 4 (four) times daily. 09/22/13   Junious Silk, PA-C  promethazine (PHENERGAN) 25 MG tablet Take 1 tablet (25 mg total) by mouth every 6 (six) hours as needed for nausea. 11/01/11 11/08/11  Adam Phenix, MD  promethazine (PHENERGAN) 25 MG tablet Take 1 tablet (25 mg total) by mouth every 6 (six) hours as needed for nausea or vomiting. 09/22/13   Junious Silk, PA-C   There were no vitals taken for this visit. Physical Exam  Constitutional: She is oriented to person, place, and time. She appears well-developed and well-nourished. No distress.  HENT:  Head: Normocephalic and atraumatic.  Moderate swelling of oropharynx, no obvious abscess, no discharge, no drainage, no exudates, no shifting of uvula, no stridor, airway is intact  Eyes: Conjunctivae and EOM are normal. Pupils are equal, round, and reactive to light.  Neck: Normal range of motion. Neck supple. No tracheal deviation present.  Cardiovascular: Normal rate and regular rhythm.  Exam reveals no gallop and no friction rub.   No murmur heard. Pulmonary/Chest: Effort normal and breath sounds normal. No respiratory distress. She has no wheezes. She has no rales. She exhibits no tenderness.  Abdominal: Soft. Bowel sounds are normal. She exhibits no distension and no mass. There is no tenderness.  There is no rebound and no guarding.  Musculoskeletal: Normal range of motion. She exhibits no edema or tenderness.  Neurological: She is alert and oriented to person, place, and time.  Skin: Skin is warm and dry.  Psychiatric: She has a normal mood and affect. Her behavior is normal. Judgment and thought content normal.  Nursing note and vitals reviewed.   ED Course  Procedures (including critical care time)  DIAGNOSTIC STUDIES: Oxygen Saturation is 100% on RA, normal by my interpretation.    COORDINATION OF CARE:  2:12 PM Discussed plan to  obtain CT of neck.  Will order pain medication and anti-inflammatory. Patient acknowledges and agrees with plan.    Labs Review Labs Reviewed - No data to display  Imaging Review No results found.   EKG Interpretation None      MDM   Final diagnoses:  Cellulitis of other specified site   Patient with severe sore throat x 2 days.  She does have significant left-sided pharyngeal swelling.  Concern for abscess.  Feel that CT is indicated at this time to rule out deep space infection.  CT negative for abscess.  CT consistent with retropharyngeal cellulitis.  Patient discussed with Dr. Romeo AppleHarrison, who recommends consulting with ENT.  4:49 PM Appreciate consultation with Dr. Annalee GentaShoemaker, who recommends treating with augmentin.  No drainable abscess.  F/u if symptoms persist.  Return precautions given. Patient is well appearing, no airway compromise.  Agree with plan.  DC to home.   Roxy Horsemanobert Furqan Gosselin, PA-C 06/02/15 1651  Purvis SheffieldForrest Harrison, MD 06/03/15 0900

## 2015-06-02 NOTE — Discharge Instructions (Signed)
You have been diagnosed with retropharyngeal cellulitis.  This should improve with antibiotics.  Please see information below regarding retropharyngeal abscess, which is more serious, and for which you would need to return to the ER.  Retropharyngeal Abscess A retropharyngeal abscess is an abscess that is located in the back of the throat. An abscess is an area infected by bacteria and contains a collection of pus.  SYMPTOMS   On one or both sides of the back of the throat, an abscess usually causes:  Swelling.  Pain.  Tenderness.  Inflammation.  High fever.  Stiff neck.  Occasionally, difficulty breathing.  Difficulty or pain when swallowing or both.  Muffled voice. DIAGNOSIS  The diagnosis is often made after a physical exam. Sometimes specialized X-ray exams are done. TREATMENT  The treatment of retropharyngeal abscess usually consists of:  Antibiotic medicines.  Drainage (an incision is made over the abscess, and the pus is drained out of the abscess). HOME CARE INSTRUCTIONS  Only take over-the-counter or prescription medicines for pain, discomfort, or fever as directed by your health care provider.  SEEK MEDICAL CARE IF:  You cannot swallow or you are starting to drool because of pain when you swallow.  You are not able to lie flat without feeling short of breath. SEEK IMMEDIATE MEDICAL CARE IF:   You develop increased pain, swelling, drainage, or bleeding in the wound site.  You develop signs of generalized infection including muscle aches, chills, fever, or a general ill feeling.  You develop a stiff neck or severe headache not relieved with medicines.  You have a fever.  You have a worsening of any of the problems that brought you to your health care provider. MAKE SURE YOU:  Understand these instructions.   Will watch your condition.   Will get help right away if you are not doing well or get worse. Document Released: 02/16/2001 Document Revised:  08/25/2013 Document Reviewed: 06/01/2013 [facility] Patient Information 2015 [place], [place]. This information is not intended to replace advice given to you by your health care provider. Make sure you discuss any questions you have with your health care provider.

## 2015-06-02 NOTE — ED Notes (Signed)
Patient states sore throat x 2 days with swollen lymph glands.   Patient states has taken ibuprofen and tylenol at home with no relief.   Patient has history of tonsillectomy.

## 2015-11-19 ENCOUNTER — Emergency Department (HOSPITAL_COMMUNITY)
Admission: EM | Admit: 2015-11-19 | Discharge: 2015-11-19 | Disposition: A | Discharge status: Home / Self Care | Payer: Self-pay | Attending: Emergency Medicine | Admitting: Emergency Medicine

## 2015-11-19 ENCOUNTER — Encounter (HOSPITAL_COMMUNITY): Payer: Self-pay | Admitting: *Deleted

## 2015-11-19 DIAGNOSIS — K047 Periapical abscess without sinus: Secondary | ICD-10-CM | POA: Insufficient documentation

## 2015-11-19 DIAGNOSIS — Z8659 Personal history of other mental and behavioral disorders: Secondary | ICD-10-CM | POA: Insufficient documentation

## 2015-11-19 DIAGNOSIS — Z91040 Latex allergy status: Secondary | ICD-10-CM | POA: Insufficient documentation

## 2015-11-19 DIAGNOSIS — J45909 Unspecified asthma, uncomplicated: Secondary | ICD-10-CM | POA: Insufficient documentation

## 2015-11-19 DIAGNOSIS — F17210 Nicotine dependence, cigarettes, uncomplicated: Secondary | ICD-10-CM | POA: Insufficient documentation

## 2015-11-19 DIAGNOSIS — Z792 Long term (current) use of antibiotics: Secondary | ICD-10-CM | POA: Insufficient documentation

## 2015-11-19 MED ORDER — PENICILLIN V POTASSIUM 500 MG PO TABS: 500.0000 mg | ORAL_TABLET | Freq: Four times a day (QID) | ORAL | Status: AC

## 2015-11-19 NOTE — ED Notes (Signed)
Pt reports dental pain has been ongoing for 11/2 weeks. The toth is located on Lt lower site.

## 2015-11-19 NOTE — Discharge Instructions (Signed)
You have a dental injury. It is very important that you get evaluated by a dentist as soon as possible. Call tomorrow to schedule an appointment. Take your full course of antibiotics. Read the instructions below. ° °Eat a soft or liquid diet and rinse your mouth out after meals with warm water. You should see a dentist or return here at once if you have increased swelling, increased pain or uncontrolled bleeding from the site of your injury. ° °SEEK MEDICAL CARE IF:  °· You have increased pain not controlled with medicines.  °· You have swelling around your tooth, in your face or neck.  °· You have bleeding which starts, continues, or gets worse.  °· You have a fever >101 °· If you are unable to open your mouth ° ° °Dental Care: °Organization         Address  Phone  Notes  °[facility] [address], [place] [phone] Accepts children up to age 21 who are enrolled in Medicaid or [place] Health Choice; pregnant women with a Medicaid card; and children who have applied for Medicaid or [place] Health Choice, but were declined, whose parents can pay a reduced fee at time of service.  °[facility] [place]  [address], [place] [phone] Accepts children up to age 21 who are enrolled in Medicaid or [place] Health Choice; pregnant women with a Medicaid card; and children who have applied for Medicaid or [place] Health Choice, but were declined, whose parents can pay a reduced fee at time of service.  °[facility] ° [address], [place] [phone] Patients are seen by appointment only. Walk-ins are not accepted. [facility] will see patients 18 years of age and older. °Monday - Tuesday (8am-5pm) °Most Wednesdays (8:30-5pm) °$30 per visit, cash only  °[facility] ° [address], [place] [phone] Patients are seen by appointment only.  Walk-ins are not accepted. [facility] will see patients 18 years of age and older. °One Wednesday Evening (Monthly: Volunteer Based).  $30 per visit, cash only  °[facility]  [phone] for adults; Children under age 4, call Graduate Pediatric Dentistry at [phone]. Children aged 4-14, please call [phone] to request a pediatric application. ° Dental services are provided in all areas of dental care including fillings, crowns and bridges, complete and partial dentures, implants, gum treatment, root canals, and extractions. Preventive care is also provided. Treatment is provided to both adults and children. °Patients are selected via a lottery and there is often a waiting list. °  °[facility] [address], °[place] ° [phone] [email or web address] °  °[facility] [address], [place], [place] [phone], Ext. [phone] Second and Fourth Thursday of each month, opens at 6:30 AM; Clinic ends at 9 AM.  Patients are seen on a first-come first-served basis, and a limited number are seen during each clinic.  ° °[facility] ° [address], [place], [place] [phone]   Eligibility Requirements °You must have lived in [place], [place], or [place] counties for at least the last three months. °  You cannot be eligible for state or federal sponsored healthcare insurance, including Veterans Administration, Medicaid, or Medicare. °  You generally cannot be eligible for healthcare insurance through your employer.  °  How to apply: °Eligibility   screenings are held every Tuesday and Wednesday afternoon from 1:00 pm until 4:00 pm. You do not need an appointment for the interview!  [facility] [address], [place], [place] [phone]   [facility]  [phone]   [facility]  [phone]   [facility]  [phone]

## 2015-11-19 NOTE — ED Provider Notes (Signed)
CSN: [id number]     Arrival date & time 11/19/15  1353 History  By signing my name below, I, Elon Spanner, attest that this documentation has been prepared under the direction and in the presence of [facility], PA-C. Electronically Signed: Elon Spanner ED Scribe. 11/19/2015. 2:10 PM.    No chief complaint on file.  The history is provided by the patient. No language interpreter was used.   HPI Comments: Kelli Barnes is a 30 y.o. female who presents to the Emergency Department complaining of unchanged, throbbing left lower dental pain and facial swelling onset 1 week ago. The pain has been somewhat relieved by iboprofen and orajel.  The patient reports a hx of fx'd tooth near the area of complaint and is not followed by a dentist.  She denies SOB, difficulty breathing, trouble swallowing, sore throat.    Past Medical History  Diagnosis Date  . Asthma   . Depression   . Normal pregnancy 06/14/2012  . SVD (spontaneous vaginal delivery) 06/15/2012  . Bipolar disorder    Past Surgical History  Procedure Laterality Date  . Tonsillectomy    . Breast surgery     No family history on file. Social History  Substance Use Topics  . Smoking status: Current Every Day Smoker -- 0.50 packs/day    Types: Cigarettes  . Smokeless tobacco: Never Used  . Alcohol Use: No   OB History    Gravida Para Term Preterm AB TAB SAB Ectopic Multiple Living   3 1 1  0 2 0 2 0 0 1     Review of Systems 10 Systems reviewed and all are negative for acute change except as noted in the HPI.   Allergies  Latex and Lamictal  Home Medications   Prior to Admission medications   Medication Sig Start Date End Date Taking? Authorizing Provider  amoxicillin-clavulanate (AUGMENTIN) 875-125 MG per tablet Take 1 tablet by mouth every 12 (twelve) hours. 06/02/15   Roxy Horseman, PA-C  HYDROcodone-acetaminophen (NORCO/VICODIN) 5-325 MG per tablet Take 1 tablet by mouth every 6 (six) hours as needed. 06/02/15    Roxy Horseman, PA-C  NAPROXEN PO Take 1 tablet by mouth daily as needed (pain).    Historical Provider, MD  oxyCODONE-acetaminophen (PERCOCET/ROXICET) 5-325 MG per tablet Take 2 tablets by mouth every 6 (six) hours as needed for severe pain. 09/22/13   Junious Silk, PA-C  penicillin v potassium (VEETID) 500 MG tablet Take 1 tablet (500 mg total) by mouth 4 (four) times daily. 11/19/15 11/26/15  Chase Picket [place], PA-C  promethazine (PHENERGAN) 25 MG tablet Take 1 tablet (25 mg total) by mouth every 6 (six) hours as needed for nausea. 11/01/11 11/08/11  Adam Phenix, MD  promethazine (PHENERGAN) 25 MG tablet Take 1 tablet (25 mg total) by mouth every 6 (six) hours as needed for nausea or vomiting. 09/22/13   Junious Silk, PA-C   BP 136/85 mmHg  Pulse 81  Temp(Src) 98.5 F (36.9 C) (Oral)  Resp 16  Ht 5\' 8"  (1.727 m)  Wt 72.576 kg  BMI 24.33 kg/m2  SpO2 100% Physical Exam  Constitutional: She is oriented to person, place, and time. She appears well-developed and well-nourished. No distress.  Speaking in full sentences.   HENT:  Head: Normocephalic and atraumatic.  Mouth/Throat:    Dental cavities and poor oral dentition noted, pain along tooth as depicted in image, midline uvula, no trismus, oropharynx moist and clear, no abscess noted, no oropharyngeal erythema or edema, neck  supple and no tenderness. No facial edema  Neck: Neck supple. No tracheal deviation present.  Cardiovascular: Normal rate, regular rhythm and normal heart sounds.  Exam reveals no gallop and no friction rub.   No murmur heard. Pulmonary/Chest: Effort normal and breath sounds normal. No stridor. No respiratory distress. She has no wheezes. She has no rales.  Musculoskeletal: Normal range of motion.  Neurological: She is alert and oriented to person, place, and time.  Skin: Skin is warm and dry.  Psychiatric: She has a normal mood and affect. Her behavior is normal.  Nursing note and vitals reviewed.   ED  Course  Procedures (including critical care time)  DIAGNOSTIC STUDIES: Oxygen Saturation is 100% on RA, normal by my interpretation.    COORDINATION OF CARE:  2:22 PM Discussed treatment plan with patient at bedside.  Patient acknowledges and agrees with plan.    Labs Review Labs Reviewed - No data to display  Imaging Review No results found. I have personally reviewed and evaluated these images and lab results as part of my medical decision-making.   EKG Interpretation None      MDM   Final diagnoses:  Dental infection   Patient with dentalgia. No abscess requiring immediate incision and drainage. Patient is afebrile, non toxic appearing, and swallowing secretions well. Exam not concerning for Ludwig's angina or pharyngeal abscess. Will treat with PenVK. I gave patient referral to dentist and stressed the importance of dental follow up for ultimate management of dental pain. Patient voices understanding and is agreeable to plan. Dental block was offered, but patient denied.    Periodontal Exam   I personally performed the services described in this documentation, which was scribed in my presence. The recorded information has been reviewed and is accurate.   [facility] CenterJaime Pilcher Kierstynn Babich, PA-C 11/19/15 1437  Raeford RazorStephen Kohut, MD 11/20/15 [phone]

## 2015-11-19 NOTE — ED Notes (Signed)
Declined W/C at D/C and was escorted to lobby by RN. 

## 2016-01-07 ENCOUNTER — Encounter (HOSPITAL_COMMUNITY): Payer: Self-pay | Admitting: Emergency Medicine

## 2016-01-07 ENCOUNTER — Emergency Department (HOSPITAL_COMMUNITY)
Admission: EM | Admit: 2016-01-07 | Discharge: 2016-01-07 | Disposition: A | Discharge status: Home / Self Care | Payer: Self-pay | Attending: Emergency Medicine | Admitting: Emergency Medicine

## 2016-01-07 DIAGNOSIS — Y9389 Activity, other specified: Secondary | ICD-10-CM | POA: Insufficient documentation

## 2016-01-07 DIAGNOSIS — Z8659 Personal history of other mental and behavioral disorders: Secondary | ICD-10-CM | POA: Insufficient documentation

## 2016-01-07 DIAGNOSIS — J45909 Unspecified asthma, uncomplicated: Secondary | ICD-10-CM | POA: Insufficient documentation

## 2016-01-07 DIAGNOSIS — S91119A Laceration without foreign body of unspecified toe without damage to nail, initial encounter: Secondary | ICD-10-CM

## 2016-01-07 DIAGNOSIS — Z91040 Latex allergy status: Secondary | ICD-10-CM | POA: Insufficient documentation

## 2016-01-07 DIAGNOSIS — Y9289 Other specified places as the place of occurrence of the external cause: Secondary | ICD-10-CM | POA: Insufficient documentation

## 2016-01-07 DIAGNOSIS — F17210 Nicotine dependence, cigarettes, uncomplicated: Secondary | ICD-10-CM | POA: Insufficient documentation

## 2016-01-07 DIAGNOSIS — Z792 Long term (current) use of antibiotics: Secondary | ICD-10-CM | POA: Insufficient documentation

## 2016-01-07 DIAGNOSIS — Y998 Other external cause status: Secondary | ICD-10-CM | POA: Insufficient documentation

## 2016-01-07 DIAGNOSIS — S91112A Laceration without foreign body of left great toe without damage to nail, initial encounter: Secondary | ICD-10-CM | POA: Insufficient documentation

## 2016-01-07 DIAGNOSIS — W2209XA Striking against other stationary object, initial encounter: Secondary | ICD-10-CM | POA: Insufficient documentation

## 2016-01-07 MED ORDER — HYDROCODONE-ACETAMINOPHEN 5-325 MG PO TABS
1.0000 | ORAL_TABLET | Freq: Once | ORAL | Status: AC
Start: 2016-01-07 — End: 2016-01-07
  Administered 2016-01-07: 1 via ORAL
  Filled 2016-01-07: qty 1

## 2016-01-07 MED ORDER — MUPIROCIN 2 % EX OINT: TOPICAL_OINTMENT | CUTANEOUS | Status: DC

## 2016-01-07 NOTE — ED Notes (Signed)
Stubbed left great toe on a tree root. Lac to end of toe. Denies joint pain.

## 2016-01-07 NOTE — Discharge Instructions (Signed)
Apply antibiotic ointment to affect area 1-2 times a day Keep affected area clean and dry!!  Please return to the ER for redness spreading around the area, new or worsening symptoms, any additional concerns.

## 2016-01-07 NOTE — ED Provider Notes (Signed)
CSN: [id number]     Arrival date & time 01/07/16  1423 History  By signing my name below, I, Octavia Heir, attest that this documentation has been prepared under the direction and in the presence of [employer], PA-C. Electronically Signed: Octavia Heir, ED Scribe. 01/07/2016. 3:10 PM.      Chief Complaint  Patient presents with  . Toe Injury      The history is provided by the patient. No language interpreter was used.   HPI Comments: Kelli Barnes is a 30 y.o. female who has a hx of bipolar disorder, depression, and asthma presents to the Emergency Department complaining of constant left big toe pain onset this afternoon PTA after stumping her toe. Pt has a small laceration at the end of her left big toe near the nail bed. `She reports feeling mild numbness on the bottom of her big toe. Pt states that she was playing disk off in flip flops when she hit her foot on the root of a tree. Pt has not taken any medicaiton to alleviate her pain. She denies any joint pain.  Past Medical History  Diagnosis Date  . Asthma   . Depression   . Normal pregnancy 06/14/2012  . SVD (spontaneous vaginal delivery) 06/15/2012  . Bipolar disorder [facility])    Past Surgical History  Procedure Laterality Date  . Tonsillectomy    . Breast surgery     No family history on file. Social History  Substance Use Topics  . Smoking status: Current Every Day Smoker -- 0.50 packs/day    Types: Cigarettes  . Smokeless tobacco: Never Used  . Alcohol Use: No   OB History    Gravida Para Term Preterm AB TAB SAB Ectopic Multiple Living   0 2 0 2 0 0 1     Review of Systems  Constitutional: Negative for fever and chills.  HENT: Negative for congestion and sore throat.   Eyes: Negative for visual disturbance.  Respiratory: Negative for cough, shortness of breath and wheezing.   Cardiovascular: Negative.   Gastrointestinal: Negative for nausea, vomiting and abdominal pain.  Genitourinary: Negative for  dysuria.  Musculoskeletal: Positive for arthralgias. Negative for back pain and neck pain.  Skin: Positive for wound. Negative for rash.  Neurological: Negative for dizziness, weakness and headaches.      Allergies  Latex and Lamictal  Home Medications   Prior to Admission medications   Medication Sig Start Date End Date Taking? Authorizing Provider  amoxicillin-clavulanate (AUGMENTIN) 875-125 MG per tablet Take 1 tablet by mouth every 12 (twelve) hours. 06/02/15   Roxy Horseman, PA-C  HYDROcodone-acetaminophen (NORCO/VICODIN) 5-325 MG per tablet Take 1 tablet by mouth every 6 (six) hours as needed. 06/02/15   Roxy Horseman, PA-C  mupirocin ointment (BACTROBAN) 2 % Apply to affected area twice daily. 01/07/16   Chase Picket Simonne Boulos, PA-C  NAPROXEN PO Take 1 tablet by mouth daily as needed (pain).    Historical Provider, MD  oxyCODONE-acetaminophen (PERCOCET/ROXICET) 5-325 MG per tablet Take 2 tablets by mouth every 6 (six) hours as needed for severe pain. 09/22/13   Junious Silk, PA-C  promethazine (PHENERGAN) 25 MG tablet Take 1 tablet (25 mg total) by mouth every 6 (six) hours as needed for nausea. 11/01/11 11/08/11  Adam Phenix, MD  promethazine (PHENERGAN) 25 MG tablet Take 1 tablet (25 mg total) by mouth every 6 (six) hours as needed for nausea or vomiting. 09/22/13   Junious Silk, PA-C  Triage vitals: BP 118/75 mmHg  Pulse 101  Temp(Src) 99 F (37.2 C) (Oral)  Resp 18  Wt 160 lb (72.576 kg)  SpO2 97% Physical Exam  Constitutional: She is oriented to person, place, and time. She appears well-developed and well-nourished. No distress.  HENT:  Head: Normocephalic and atraumatic.  Neck: Normal range of motion. Neck supple.  Cardiovascular: Normal rate, regular rhythm and normal heart sounds.   Good cap refill  Pulmonary/Chest: Effort normal and breath sounds normal. No respiratory distress. She has no wheezes. She has no rales.  Abdominal: Soft. She exhibits no  distension. There is no tenderness.  Musculoskeletal: Normal range of motion. She exhibits no edema.  Full ROM, 1.5cm laceration to distal left big toe, Sensation intact, 2+ DP pulses  Neurological: She is alert and oriented to person, place, and time. No sensory deficit.  Skin: Skin is warm and dry.  Psychiatric: She has a normal mood and affect. Her behavior is normal.  Nursing note and vitals reviewed.   ED Course  Procedures   LACERATION REPAIR Performed by: Chase Picket Shaketha Jeon  Authorized by: Chase Picket Georg Ang Patient identity confirmed: provided demographic data Consent: Verbal consent obtained. Consent given by: patient  Risks and benefits: risks, benefits and alternatives were discussed Prepped and Draped in normal sterile fashion Wound explored Body area: left foot Location details: big toe Laceration length: 1.5 cm Foreign bodies: No foreign bodies seen or palpated. Tendon involvement: none Nerve involvement: none Vascular damage: none Anesthesia: none Patient sedated: no Irrigation solution: saline Irrigation method: syringe Amount of cleaning: standard Skin closure: Dermabond Approximation: close Patient tolerance: Patient tolerated the procedure well with no immediate complications.  DIAGNOSTIC STUDIES: Oxygen Saturation is 97% on RA, normal by my interpretation.  COORDINATION OF CARE:  3:08 PM Discussed treatment plan which includes clean laceration around the area with pt at bedside and pt agreed to plan. Discussed cleaning instructions with pt.   Labs Review Labs Reviewed - No data to display  Imaging Review No results found. I have personally reviewed and evaluated these images and lab results as part of my medical decision-making.   EKG Interpretation None      MDM   Final diagnoses:  None   Kelli Barnes presents after hitting toe on tree root just PTA. 1.5 laceration of left big toe which was thoroughly cleaned in ED by me and repaired  with dermabond. Strict return precautions given. Home care instructions given. All questions answered.   I personally performed the services described in this documentation, which was scribed in my presence. The recorded information has been reviewed and is accurate.  [facility] Klay Sobotka, PA-C 01/07/16 1559  Arby Barrette, MD 01/08/16 [phone]

## 2016-01-07 NOTE — ED Notes (Signed)
Declined W/C at D/C and was escorted to lobby by RN. 

## 2018-08-04 ENCOUNTER — Emergency Department (HOSPITAL_COMMUNITY)
Admission: EM | Admit: 2018-08-04 | Discharge: 2018-08-04 | Disposition: A | Discharge status: Home / Self Care | Payer: Medicaid Other | Attending: Emergency Medicine | Admitting: Emergency Medicine

## 2018-08-04 ENCOUNTER — Encounter (HOSPITAL_COMMUNITY): Payer: Self-pay | Admitting: Emergency Medicine

## 2018-08-04 ENCOUNTER — Emergency Department (HOSPITAL_COMMUNITY): Payer: Medicaid Other

## 2018-08-04 DIAGNOSIS — F17210 Nicotine dependence, cigarettes, uncomplicated: Secondary | ICD-10-CM | POA: Insufficient documentation

## 2018-08-04 DIAGNOSIS — Y929 Unspecified place or not applicable: Secondary | ICD-10-CM | POA: Insufficient documentation

## 2018-08-04 DIAGNOSIS — Z79899 Other long term (current) drug therapy: Secondary | ICD-10-CM | POA: Insufficient documentation

## 2018-08-04 DIAGNOSIS — Y999 Unspecified external cause status: Secondary | ICD-10-CM | POA: Insufficient documentation

## 2018-08-04 DIAGNOSIS — Z91040 Latex allergy status: Secondary | ICD-10-CM | POA: Insufficient documentation

## 2018-08-04 DIAGNOSIS — W228XXA Striking against or struck by other objects, initial encounter: Secondary | ICD-10-CM | POA: Insufficient documentation

## 2018-08-04 DIAGNOSIS — J45909 Unspecified asthma, uncomplicated: Secondary | ICD-10-CM | POA: Insufficient documentation

## 2018-08-04 DIAGNOSIS — Y939 Activity, unspecified: Secondary | ICD-10-CM | POA: Insufficient documentation

## 2018-08-04 DIAGNOSIS — S92511A Displaced fracture of proximal phalanx of right lesser toe(s), initial encounter for closed fracture: Secondary | ICD-10-CM | POA: Insufficient documentation

## 2018-08-04 NOTE — ED Triage Notes (Signed)
Pt c/o 4th toe pain after hitting in on door this morning.

## 2018-08-04 NOTE — ED Provider Notes (Signed)
[facility]-EMERGENCY DEPT Provider Note   CSN: [id number] Arrival date & time: 08/04/18  1535     History   Chief Complaint Chief Complaint  Patient presents with  . Toe Injury    HPI TIARRA ANASTACIO is a 32 y.o. female center for evaluation of right toe pain.  Patient states around 8:00 this morning she accidentally jammed her right foot against the door.  She had acute onset fourth toe pain.  She denies numbness or tingling.  She has not taken anything for pain including Tylenol or ibuprofen.  There is no pain when she is at rest, she has increased pain with walking, palpation, movement.  She denies injury elsewhere.  She has no medical problems, takes no medications daily.  No radiation of the pain.  HPI  Past Medical History:  Diagnosis Date  . Asthma   . Bipolar disorder (HCC)   . Depression   . Normal pregnancy 06/14/2012  . SVD (spontaneous vaginal delivery) 06/15/2012    Patient Active Problem List   Diagnosis Date Noted  . Bipolar disorder (HCC)   . SVD (spontaneous vaginal delivery) 06/15/2012  . Normal pregnancy 06/14/2012  . Hyperemesis arising during pregnancy 10/31/2011    Past Surgical History:  Procedure Laterality Date  . BREAST SURGERY    . TONSILLECTOMY       OB History    Gravida  3   Para  1   Term  1   Preterm  0   AB  2   Living  1     SAB  2   TAB  0   Ectopic  0   Multiple  0   Live Births  1            Home Medications    Prior to Admission medications   Medication Sig Start Date End Date Taking? Authorizing Provider  amoxicillin-clavulanate (AUGMENTIN) 875-125 MG per tablet Take 1 tablet by mouth every 12 (twelve) hours. 06/02/15   Roxy Horseman, PA-C  HYDROcodone-acetaminophen (NORCO/VICODIN) 5-325 MG per tablet Take 1 tablet by mouth every 6 (six) hours as needed. 06/02/15   Roxy Horseman, PA-C  mupirocin ointment (BACTROBAN) 2 % Apply to affected area twice daily. 01/07/16   Ward,  Chase Picket, PA-C  NAPROXEN PO Take 1 tablet by mouth daily as needed (pain).    [provider]  oxyCODONE-acetaminophen (PERCOCET/ROXICET) 5-325 MG per tablet Take 2 tablets by mouth every 6 (six) hours as needed for severe pain. 09/22/13   Junious Silk, PA-C  promethazine (PHENERGAN) 25 MG tablet Take 1 tablet (25 mg total) by mouth every 6 (six) hours as needed for nausea. 11/01/11 11/08/11  Adam Phenix, MD  promethazine (PHENERGAN) 25 MG tablet Take 1 tablet (25 mg total) by mouth every 6 (six) hours as needed for nausea or vomiting. 09/22/13   Junious Silk, PA-C    Family History No family history on file.  Social History Social History   Tobacco Use  . Smoking status: Current Every Day Smoker    Packs/day: 0.50    Types: Cigarettes  . Smokeless tobacco: Never Used  Substance Use Topics  . Alcohol use: No  . Drug use: Yes    Types: Marijuana    Comment: early pregnancy     Allergies   Latex and Lamictal [lamotrigine]   Review of Systems Review of Systems  Musculoskeletal: Positive for arthralgias and joint swelling.  Neurological: Negative for numbness.  Physical Exam Updated Vital Signs BP (!) 125/92 (BP Location: Left Arm)   Pulse 69   Temp 99 F (37.2 C) (Oral)   Resp 17   Ht 5\' 8"  (1.727 m)   Wt 70.9 kg   LMP 07/07/2018   SpO2 98%   BMI 23.77 kg/m   Physical Exam  Constitutional: She is oriented to person, place, and time. She appears well-developed and well-nourished. No distress.  HENT:  Head: Normocephalic and atraumatic.  Eyes: EOM are normal.  Neck: Normal range of motion.  Pulmonary/Chest: Effort normal.  Abdominal: She exhibits no distension.  Musculoskeletal: Normal range of motion. She exhibits tenderness.  Tenderness palpation at the base of the right fourth toe.  Good cap refill.  Sensation intact.  Pedal pulses intact.  No tenderness elsewhere in the right foot.  Neurological: She is alert and oriented to person,  place, and time. No sensory deficit.  Skin: Skin is warm. Capillary refill takes less than 2 seconds. No rash noted.  Psychiatric: She has a normal mood and affect.  Nursing note and vitals reviewed.    ED Treatments / Results  Labs (all labs ordered are listed, but only abnormal results are displayed) Labs Reviewed - No data to display  EKG None  Radiology Dg Foot Complete Right  Result Date: 08/04/2018 CLINICAL DATA:  Fourth toe pain after hitting on door EXAM: RIGHT FOOT COMPLETE - 3+ VIEW COMPARISON:  None. FINDINGS: Acute minimally displaced fracture involving the shaft and head of the fourth proximal phalanx. No definite articular extension. No subluxation IMPRESSION: Acute minimally displaced fracture involving the fourth proximal phalanx Electronically Signed   By: Jasmine PangKim  Fujinaga M.D.   On: 08/04/2018 16:45    Procedures Procedures (including critical care time)  Medications Ordered in ED Medications - No data to display   Initial Impression / Assessment and Plan / ED Course  I have reviewed the triage vital signs and the nursing notes.  Pertinent labs & imaging results that were available during my care of the patient were reviewed by me and considered in my medical decision making (see chart for details).     Pt presenting for evaluation of right hip pain.  Physical examination, she is neurovascularly intact.  X-rays viewed and interpreted by me, shows fourth proximal phalanx fracture.  Discussed pain control with Tylenol, ibuprofen, ice, and elevation.  Will give postop shoe and buddy tape.  At this time, patient appears safe for discharge.  Return precautions given.  Patient states she understands and agrees to plan.  Final Clinical Impressions(s) / ED Diagnoses   Final diagnoses:  Closed displaced fracture of proximal phalanx of lesser toe of right foot, initial encounter    ED Discharge Orders    None       Alveria ApleyCaccavale, Celestial Barnfield, PA-C 08/04/18 1743      Jacalyn LefevreHaviland, Julie, MD 08/04/18 1810

## 2018-08-04 NOTE — Discharge Instructions (Addendum)
Take ibuprofen 3 times a day with meals. Take 800 mg (4 pills) at a time. Do not take other anti-inflammatories at the same time (Advil, Motrin, naproxen, Aleve). You may supplement with Tylenol if you need further pain control. Use ice packs to help with pain. Wear the post op shoe and use the buddy taping to help with pain.  Rest, ice, and elevate your foot. Return to the ER with any new, worsening, or concerning symptoms.

## 2019-04-24 ENCOUNTER — Other Ambulatory Visit: Payer: Self-pay | Admitting: *Deleted

## 2019-04-24 DIAGNOSIS — Z20822 Contact with and (suspected) exposure to covid-19: Secondary | ICD-10-CM

## 2019-04-26 LAB — NOVEL CORONAVIRUS, NAA: SARS-CoV-2, NAA: NOT DETECTED

## 2020-11-28 DIAGNOSIS — Z1152 Encounter for screening for COVID-19: Secondary | ICD-10-CM | POA: Diagnosis not present

## 2021-04-17 ENCOUNTER — Other Ambulatory Visit: Payer: Self-pay

## 2021-04-17 ENCOUNTER — Encounter (HOSPITAL_COMMUNITY): Payer: Self-pay

## 2021-04-17 ENCOUNTER — Emergency Department (HOSPITAL_COMMUNITY)
Admission: EM | Admit: 2021-04-17 | Discharge: 2021-04-17 | Disposition: A | Discharge status: Home / Self Care | Payer: Medicaid Other | Attending: Emergency Medicine | Admitting: Emergency Medicine

## 2021-04-17 ENCOUNTER — Emergency Department (HOSPITAL_COMMUNITY): Payer: Medicaid Other

## 2021-04-17 DIAGNOSIS — J45909 Unspecified asthma, uncomplicated: Secondary | ICD-10-CM | POA: Diagnosis not present

## 2021-04-17 DIAGNOSIS — Z91040 Latex allergy status: Secondary | ICD-10-CM | POA: Diagnosis not present

## 2021-04-17 DIAGNOSIS — F17210 Nicotine dependence, cigarettes, uncomplicated: Secondary | ICD-10-CM | POA: Diagnosis not present

## 2021-04-17 DIAGNOSIS — R079 Chest pain, unspecified: Secondary | ICD-10-CM | POA: Diagnosis not present

## 2021-04-17 DIAGNOSIS — M25512 Pain in left shoulder: Secondary | ICD-10-CM

## 2021-04-17 MED ORDER — IBUPROFEN 800 MG PO TABS
800.0000 mg | ORAL_TABLET | Freq: Once | ORAL | Status: AC
  Administered 2021-04-17: 800 mg via ORAL
  Filled 2021-04-17: qty 1

## 2021-04-17 MED ORDER — IBUPROFEN 600 MG PO TABS: 600.0000 mg | ORAL_TABLET | Freq: Three times a day (TID) | ORAL | 0 refills | Status: DC | PRN

## 2021-04-17 MED ORDER — OXYCODONE-ACETAMINOPHEN 5-325 MG PO TABS
1.0000 | ORAL_TABLET | Freq: Once | ORAL | Status: AC
  Administered 2021-04-17: 1 via ORAL
  Filled 2021-04-17: qty 1

## 2021-04-17 MED ORDER — CYCLOBENZAPRINE HCL 10 MG PO TABS: 10.0000 mg | ORAL_TABLET | Freq: Two times a day (BID) | ORAL | 0 refills | Status: AC | PRN

## 2021-04-17 MED ORDER — ACETAMINOPHEN 325 MG PO TABS: 650.0000 mg | ORAL_TABLET | Freq: Four times a day (QID) | ORAL | 0 refills | Status: DC | PRN
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
## 2021-04-17 NOTE — Progress Notes (Signed)
Orthopedic Tech Progress Note Patient Details:  Kelli Barnes 03/09/1986 [id number]  Ortho Devices Type of Ortho Device: Shoulder immobilizer Ortho Device/Splint Location: Left Arm Ortho Device/Splint Interventions: Application   Post Interventions Patient Tolerated: Well   [name] 04/17/2021, 4:49 PM

## 2022-04-29 ENCOUNTER — Ambulatory Visit (INDEPENDENT_AMBULATORY_CARE_PROVIDER_SITE_OTHER): Payer: Medicaid Other | Admitting: Primary Care

## 2022-04-29 ENCOUNTER — Encounter (INDEPENDENT_AMBULATORY_CARE_PROVIDER_SITE_OTHER): Payer: Self-pay

## 2022-07-03 ENCOUNTER — Encounter (INDEPENDENT_AMBULATORY_CARE_PROVIDER_SITE_OTHER): Payer: Self-pay | Admitting: Primary Care

## 2022-07-03 ENCOUNTER — Ambulatory Visit (INDEPENDENT_AMBULATORY_CARE_PROVIDER_SITE_OTHER): Payer: Medicaid Other | Admitting: Primary Care

## 2022-07-03 VITALS — BP 129/87 | HR 71 | Temp 98.4°F | Ht 68.0 in | Wt 160.0 lb

## 2022-07-03 DIAGNOSIS — Z131 Encounter for screening for diabetes mellitus: Secondary | ICD-10-CM | POA: Diagnosis not present

## 2022-07-03 DIAGNOSIS — Z23 Encounter for immunization: Secondary | ICD-10-CM

## 2022-07-03 DIAGNOSIS — Z7689 Persons encountering health services in other specified circumstances: Secondary | ICD-10-CM

## 2022-07-03 LAB — POCT GLYCOSYLATED HEMOGLOBIN (HGB A1C): Hemoglobin A1C: 5.5 % (ref 4.0–5.6)

## 2022-07-03 NOTE — Progress Notes (Signed)
New Patient Office Visit  Subjective    Patient ID: Kelli Barnes, female    DOB: 09/16/1986  Age: 36 y.o. MRN: [id number]  CC:  Chief Complaint  Patient presents with   New Patient (Initial Visit)    HPI Ms.Kelli Barnes is a 36 year old female who presents to establish care.  She has a family history of diabetes and has noticed increased polydipsia but denies polyuria or polyphagia.  Patient states that she has had visual problems all her life. Patient has No headache, No chest pain, No abdominal pain - No Nausea, No new weakness tingling or numbness, No Cough - shortness of breath.   Outpatient Encounter Medications as of 07/03/2022  Medication Sig   promethazine (PHENERGAN) 25 MG tablet Take 1 tablet (25 mg total) by mouth every 6 (six) hours as needed for nausea.   [DISCONTINUED] acetaminophen (TYLENOL) 325 MG tablet Take 2 tablets (650 mg total) by mouth every 6 (six) hours as needed for up to 30 doses.   [DISCONTINUED] amoxicillin-clavulanate (AUGMENTIN) 875-125 MG per tablet Take 1 tablet by mouth every 12 (twelve) hours.   [DISCONTINUED] HYDROcodone-acetaminophen (NORCO/VICODIN) 5-325 MG per tablet Take 1 tablet by mouth every 6 (six) hours as needed.   [DISCONTINUED] ibuprofen (ADVIL) 600 MG tablet Take 1 tablet (600 mg total) by mouth every 8 (eight) hours as needed for headache or moderate pain.   [DISCONTINUED] mupirocin ointment (BACTROBAN) 2 % Apply to affected area twice daily.   [DISCONTINUED] NAPROXEN PO Take 1 tablet by mouth daily as needed (pain).   [DISCONTINUED] oxyCODONE-acetaminophen (PERCOCET/ROXICET) 5-325 MG per tablet Take 2 tablets by mouth every 6 (six) hours as needed for severe pain.   [DISCONTINUED] promethazine (PHENERGAN) 25 MG tablet Take 1 tablet (25 mg total) by mouth every 6 (six) hours as needed for nausea or vomiting.   No facility-administered encounter medications on file as of 07/03/2022.    Past Medical History:  Diagnosis Date    Asthma    Bipolar disorder (HCC)    Depression    Normal pregnancy 06/14/2012   SVD (spontaneous vaginal delivery) 06/15/2012    Past Surgical History:  Procedure Laterality Date   BREAST SURGERY     TONSILLECTOMY      No family history on file.  Social History   Socioeconomic History   Marital status: Single    Spouse name: Not on file   Number of children: Not on file   Years of education: Not on file   Highest education level: Not on file  Occupational History   Not on file  Tobacco Use   Smoking status: Every Day    Packs/day: 0.50    Types: Cigarettes   Smokeless tobacco: Never  Vaping Use   Vaping Use: Never used  Substance and Sexual Activity   Alcohol use: No   Drug use: Yes    Types: Marijuana    Comment: early pregnancy   Sexual activity: Yes    Birth control/protection: None  Other Topics Concern   Not on file  Social History Narrative   Not on file   Social Determinants of Health   Financial Resource Strain: Not on file  Food Insecurity: Not on file  Transportation Needs: Not on file  Physical Activity: Not on file  Stress: Not on file  Social Connections: Not on file  Intimate Partner Violence: Not on file    ROS Comprehensive ROS Pertinent positive and negative noted in HPI  Objective    BP 129/87   Pulse 71   Temp 98.4 F (36.9 C) (Oral)   Ht 5\' 8"  (1.727 m)   Wt 160 lb (72.6 kg)   LMP 06/12/2022 (Approximate)   SpO2 98%   BMI 24.33 kg/m   Physical exam: General: Vital signs reviewed.  Patient is well-developed and well-nourished, normal weight female in no acute distress and cooperative with exam. Head: Normocephalic and atraumatic. Eyes: EOMI, conjunctivae normal, no scleral icterus. Neck: Supple, trachea midline, normal ROM, no JVD, masses, thyromegaly, or carotid bruit present. Cardiovascular: RRR, S1 normal, S2 normal, no murmurs, gallops, or rubs. Pulmonary/Chest: Clear to auscultation bilaterally, no wheezes, rales,  or rhonchi. Abdominal: Soft, non-tender, non-distended, BS +, no masses, organomegaly, or guarding present. Musculoskeletal: No joint deformities, erythema, or stiffness, ROM full and nontender. Extremities: No lower extremity edema bilaterally,  pulses symmetric and intact bilaterally. No cyanosis or clubbing. Neurological: A&O x3, Strength is normal Skin: Warm, dry and intact. No rashes or erythema. Psychiatric: Normal mood and affect. speech and behavior is normal. Cognition and memory are normal.        Assessment & Plan:  Kelli Barnes was seen today for new patient (initial visit).  Diagnoses and all orders for this visit:  Screening for diabetes mellitus -     HgB A1c 5.5 explain prediabetes starts at 5.7-6.4.  Start in some lifestyle changes with decrease risk of pre-/diabetes.  Monitoring carbohydrates-grits, potatoes, macaroni and cheese, breads, sweets, candy, cookies, and soft drinks.  Also incorporate exercising 30 minutes daily or 150 minutes weekly  Need for Tdap vaccination -     Tdap vaccine greater than or equal to 7yo IM  Encounter to establish care Establish care with PCP   Health maintenance asked to schedule Pap Wilkie Aye, NP

## 2022-07-03 NOTE — Patient Instructions (Addendum)
Tdap (Tetanus, Diphtheria, Pertussis) Vaccine: What You Need to Know 1. Why get vaccinated? Tdap vaccine can prevent tetanus, diphtheria, and pertussis. Diphtheria and pertussis spread from person to person. Tetanus enters the body through cuts or wounds. TETANUS (T) causes painful stiffening of the muscles. Tetanus can lead to serious health problems, including being unable to open the mouth, having trouble swallowing and breathing, or death. DIPHTHERIA (D) can lead to difficulty breathing, heart failure, paralysis, or death. PERTUSSIS (aP), also known as "whooping cough," can cause uncontrollable, violent coughing that makes it hard to breathe, eat, or drink. Pertussis can be extremely serious especially in babies and young children, causing pneumonia, convulsions, brain damage, or death. In teens and adults, it can cause weight loss, loss of bladder control, passing out, and rib fractures from severe coughing. 2. Tdap vaccine Tdap is only for children 7 years and older, adolescents, and adults.  Adolescents should receive a single dose of Tdap, preferably at age 10 or 71 years. Pregnant people should get a dose of Tdap during every pregnancy, preferably during the early part of the third trimester, to help protect the newborn from pertussis. Infants are most at risk for severe, life-threatening complications from pertussis. Adults who have never received Tdap should get a dose of Tdap. Also, adults should receive a booster dose of either Tdap or Td (a different vaccine that protects against tetanus and diphtheria but not pertussis) every 10 years, or after 5 years in the case of a severe or dirty wound or burn. Tdap may be given at the same time as other vaccines. 3. Talk with your health care provider Tell your vaccine provider if the person getting the vaccine: Has had an allergic reaction after a previous dose of any vaccine that protects against tetanus, diphtheria, or pertussis, or has any  severe, life-threatening allergies Has had a coma, decreased level of consciousness, or prolonged seizures within 7 days after a previous dose of any pertussis vaccine (DTP, DTaP, or Tdap) Has seizures or another nervous system problem Has ever had Guillain-Barr Syndrome (also called "GBS") Has had severe pain or swelling after a previous dose of any vaccine that protects against tetanus or diphtheria In some cases, your health care provider may decide to postpone Tdap vaccination until a future visit. People with minor illnesses, such as a cold, may be vaccinated. People who are moderately or severely ill should usually wait until they recover before getting Tdap vaccine.  Your health care provider can give you more information. 4. Risks of a vaccine reaction Pain, redness, or swelling where the shot was given, mild fever, headache, feeling tired, and nausea, vomiting, diarrhea, or stomachache sometimes happen after Tdap vaccination. People sometimes faint after medical procedures, including vaccination. Tell your provider if you feel dizzy or have vision changes or ringing in the ears.  As with any medicine, there is a very remote chance of a vaccine causing a severe allergic reaction, other serious injury, or death. 5. What if there is a serious problem? An allergic reaction could occur after the vaccinated person leaves the clinic. If you see signs of a severe allergic reaction (hives, swelling of the face and throat, difficulty breathing, a fast heartbeat, dizziness, or weakness), call 9-1-1 and get the person to the nearest hospital. For other signs that concern you, call your health care provider.  Adverse reactions should be reported to the Vaccine Adverse Event Reporting System (VAERS). Your health care provider will usually file this report, or you  can do it yourself. Visit the VAERS website at www.vaers.LAgents.no or call 512-417-1878. VAERS is only for reporting reactions, and VAERS staff  members do not give medical advice. 6. The National Vaccine Injury Compensation Program The [employer] Vaccine Injury Compensation Program (VICP) is a federal program that was created to compensate people who may have been injured by certain vaccines. Claims regarding alleged injury or death due to vaccination have a time limit for filing, which may be as short as two years. Visit the VICP website at SpiritualWord.at or call 785 163 4871 to learn about the program and about filing a claim. 7. How can I learn more? Ask your health care provider. Call your local or state health department. Visit the website of the Food and Drug Administration (FDA) for vaccine package inserts and additional information at FinderList.no. Contact the Centers for Disease Control and Prevention (CDC): Call 629-336-6903 (1-800-CDC-INFO) or Visit CDC's website at PicCapture.uy. Source: CDC Vaccine Information Statement Tdap (Tetanus, Diphtheria, Pertussis) Vaccine (06/23/2020) This same material is available at FootballExhibition.com.br for no charge. This information is not intended to replace advice given to you by your health care provider. Make sure you discuss any questions you have with your health care provider. Document Revised: 10/03/2021 Document Reviewed: 08/06/2021 Elsevier Patient Education  2023 Elsevier Inc. Preventing Hypertension Hypertension, also called high blood pressure, is when the force of blood pumping through the arteries is too strong. Arteries are blood vessels that carry blood from the heart throughout the body. Often, hypertension does not cause symptoms until blood pressure is very high. It is important to have your blood pressure checked regularly. Diet and lifestyle changes can help you prevent hypertension, and they may make you feel better overall and improve your quality of life. If you already have hypertension, you may control it with diet and  lifestyle changes, as well as with medicine. How can this condition affect me? Over time, hypertension can damage the arteries and decrease blood flow to important parts of the body, including the brain, heart, and kidneys. By keeping your blood pressure in a healthy range, you can help prevent complications like heart attack, heart failure, stroke, kidney failure, and vascular dementia. What can increase my risk? An unhealthy diet and a lack of physical activity can make you more likely to develop high blood pressure. Some other risk factors include: Age. The risk increases with age. Having family members who have had high blood pressure. Having certain health conditions, such as thyroid problems. Being overweight or obese. Drinking too much alcohol or caffeine. Having too much fat, sugar, calories, or salt (sodium) in your diet. Smoking or using illegal drugs. Taking certain medicines, such as antidepressants, decongestants, birth control pills, and NSAIDs, such as ibuprofen. What actions can I take to prevent or manage this condition? Work with your health care provider to make a hypertension prevention plan that works for you. You may be referred for counseling on a healthy diet and physical activity. Follow your plan and keep all follow-up visits. Diet changes Maintain a healthy diet. This includes: Eating less salt (sodium). Ask your health care provider how much sodium is safe for you to have. The general recommendation is to have less than 1 tsp (2,300 mg) of sodium a day. Do not add salt to your food. Choose low-sodium options when grocery shopping and eating out. Limiting fats in your diet. You can do this by eating low-fat or fat-free dairy products and by eating less red meat. Eating more fruits, vegetables,  and whole grains. Make a goal to eat: 1-2 cups of fresh fruits and vegetables each day. 3-4 servings of whole grains each day. Avoiding foods and beverages that have added  sugars. Eating fish that contain healthy fats (omega-3 fatty acids), such as mackerel or salmon. If you need help putting together a healthy eating plan, try the DASH diet. This diet is high in fruits, vegetables, and whole grains. It is low in sodium, red meat, and added sugars. DASH stands for Dietary Approaches to Stop Hypertension. Lifestyle changes  Lose weight if you are overweight. Losing just 3-5% of your body weight can help prevent or control hypertension. For example, if your present weight is 200 lb (91 kg), a loss of 3-5% of your weight means losing 6-10 lb (2.7-4.5 kg). Ask your health care provider to help you with a diet and exercise plan to safely lose weight. Get enough exercise. Do at least 150 minutes of moderate-intensity exercise each week. You could do this in short exercise sessions several times a day, or you could do longer exercise sessions a few times a week. For example, you could take a brisk 10-minute walk or bike ride, 3 times a day, for 5 days a week. Find ways to reduce stress, such as exercising, meditating, listening to music, or taking a yoga class. If you need help reducing stress, ask your health care provider. Do not use any products that contain nicotine or tobacco. These products include cigarettes, chewing tobacco, and vaping devices, such as e-cigarettes. Chemicals in tobacco and nicotine products raise your blood pressure each time you use them. If you need help quitting, ask your health care provider. Learn how to check your blood pressure at home. Make sure that you know your personal target blood pressure, as told by your health care provider. Try to sleep 7-9 hours per night. Alcohol use Do not drink alcohol if: Your health care provider tells you not to drink. You are pregnant, may be pregnant, or are planning to become pregnant. If you drink alcohol: Limit how much you have to: 0-1 drink a day for women. 0-2 drinks a day for men. Know how much  alcohol is in your drink. In the [place] one drink equals one 12 oz bottle of beer (355 mL), one 5 oz glass of wine (148 mL), or one 1 oz glass of hard liquor (44 mL). Medicines In addition to diet and lifestyle changes, your health care provider may recommend medicines to help lower your blood pressure. In general: You may need to try a few different medicines to find what works best for you. You may need to take more than one medicine. Take over-the-counter and prescription medicines only as told by your health care provider. Questions to ask your health care provider What is my blood pressure goal? How can I lower my risk for high blood pressure? How should I monitor my blood pressure at home? Where to find support Your health care provider can help you prevent hypertension and help you keep your blood pressure at a healthy level. Your local hospital or your community may also provide support services and prevention programs. The American Heart Association offers an online support network at supportnetwork.heart.org Where to find more information Learn more about hypertension from: National Heart, Lung, and Blood Institute: PopSteam.is Centers for Disease Control and Prevention: FootballExhibition.com.br American Academy of Family Physicians: familydoctor.org Learn more about the DASH diet from: National Heart, Lung, and Blood Institute: PopSteam.is Contact a health care  provider if: You think you are having a reaction to medicines you have taken. You have recurrent headaches or feel dizzy. You have swelling in your ankles. You have trouble with your vision. Get help right away if: You have sudden, severe chest, back, or abdominal pain or discomfort. You have shortness of breath. You have a sudden, severe headache. These symptoms may be an emergency. Get help right away. Call 911. Do not wait to see if the symptoms will go away. Do not drive yourself to the  hospital. Summary Hypertension often does not cause any symptoms until blood pressure is very high. It is important to get your blood pressure checked regularly. Diet and lifestyle changes are important steps in preventing hypertension. By keeping your blood pressure in a healthy range, you may prevent complications like heart attack, heart failure, stroke, and kidney failure. Work with your health care provider to make a hypertension prevention plan that works for you. This information is not intended to replace advice given to you by your health care provider. Make sure you discuss any questions you have with your health care provider. Document Revised: 08/23/2021 Document Reviewed: 08/23/2021 Elsevier Patient Education  2023 [employer].

## 2022-07-07 NOTE — Progress Notes (Signed)
BMI is within normal range

## 2022-08-05 ENCOUNTER — Ambulatory Visit (INDEPENDENT_AMBULATORY_CARE_PROVIDER_SITE_OTHER): Payer: Medicaid Other | Admitting: Primary Care

## 2022-08-19 ENCOUNTER — Ambulatory Visit (INDEPENDENT_AMBULATORY_CARE_PROVIDER_SITE_OTHER): Payer: Medicaid Other | Admitting: Primary Care

## 2022-08-19 ENCOUNTER — Encounter (INDEPENDENT_AMBULATORY_CARE_PROVIDER_SITE_OTHER): Payer: Self-pay
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
## 2023-11-05 ENCOUNTER — Encounter (HOSPITAL_BASED_OUTPATIENT_CLINIC_OR_DEPARTMENT_OTHER): Payer: Self-pay

## 2023-11-05 ENCOUNTER — Other Ambulatory Visit: Payer: Self-pay

## 2023-11-05 ENCOUNTER — Emergency Department (HOSPITAL_BASED_OUTPATIENT_CLINIC_OR_DEPARTMENT_OTHER): Payer: Medicaid Other

## 2023-11-05 ENCOUNTER — Emergency Department (HOSPITAL_BASED_OUTPATIENT_CLINIC_OR_DEPARTMENT_OTHER)
Admission: EM | Admit: 2023-11-05 | Discharge: 2023-11-05 | Disposition: A | Discharge status: Home / Self Care | Payer: Medicaid Other | Attending: Emergency Medicine | Admitting: Emergency Medicine

## 2023-11-05 DIAGNOSIS — F17210 Nicotine dependence, cigarettes, uncomplicated: Secondary | ICD-10-CM | POA: Insufficient documentation

## 2023-11-05 DIAGNOSIS — R918 Other nonspecific abnormal finding of lung field: Secondary | ICD-10-CM | POA: Diagnosis not present

## 2023-11-05 DIAGNOSIS — J168 Pneumonia due to other specified infectious organisms: Secondary | ICD-10-CM | POA: Diagnosis not present

## 2023-11-05 DIAGNOSIS — Z91040 Latex allergy status: Secondary | ICD-10-CM | POA: Diagnosis not present

## 2023-11-05 DIAGNOSIS — J189 Pneumonia, unspecified organism: Secondary | ICD-10-CM

## 2023-11-05 DIAGNOSIS — J181 Lobar pneumonia, unspecified organism: Secondary | ICD-10-CM | POA: Diagnosis not present

## 2023-11-05 DIAGNOSIS — R509 Fever, unspecified: Secondary | ICD-10-CM | POA: Diagnosis present

## 2023-11-05 DIAGNOSIS — R059 Cough, unspecified: Secondary | ICD-10-CM | POA: Diagnosis not present

## 2023-11-05 DIAGNOSIS — Z1152 Encounter for screening for COVID-19: Secondary | ICD-10-CM | POA: Diagnosis not present

## 2023-11-05 LAB — RESP PANEL BY RT-PCR (RSV, FLU A&B, COVID)  RVPGX2
Influenza A by PCR: NEGATIVE
Influenza B by PCR: NEGATIVE
Resp Syncytial Virus by PCR: NEGATIVE
SARS Coronavirus 2 by RT PCR: NEGATIVE

## 2023-11-05 MED ORDER — AMOXICILLIN 500 MG PO CAPS
1000.0000 mg | ORAL_CAPSULE | Freq: Once | ORAL | Status: AC
  Administered 2023-11-05: 1000 mg via ORAL
  Filled 2023-11-05: qty 2

## 2023-11-05 MED ORDER — ACETAMINOPHEN 325 MG PO TABS
650.0000 mg | ORAL_TABLET | Freq: Once | ORAL | Status: AC
  Administered 2023-11-05: 650 mg via ORAL
  Filled 2023-11-05: qty 2

## 2023-11-05 MED ORDER — ONDANSETRON HCL 4 MG PO TABS: 4.0000 mg | ORAL_TABLET | Freq: Four times a day (QID) | ORAL | 0 refills | Status: AC

## 2023-11-05 MED ORDER — DOXYCYCLINE HYCLATE 100 MG PO TABS: 100.0000 mg | ORAL_TABLET | Freq: Once | ORAL | Status: DC

## 2023-11-05 MED ORDER — BENZONATATE 100 MG PO CAPS: 100.0000 mg | ORAL_CAPSULE | Freq: Three times a day (TID) | ORAL | 0 refills | Status: AC

## 2023-11-05 MED ORDER — AMOXICILLIN 500 MG PO CAPS: 1000.0000 mg | ORAL_CAPSULE | Freq: Two times a day (BID) | ORAL | 0 refills | Status: AC

## 2023-11-05 MED ORDER — DOXYCYCLINE HYCLATE 100 MG PO CAPS: 100.0000 mg | ORAL_CAPSULE | Freq: Two times a day (BID) | ORAL | 0 refills | Status: DC

## 2023-11-05 NOTE — ED Provider Notes (Signed)
[place] [facility] AT [facility] Provider Note   CSN: [id number] Arrival date & time: 11/05/23  1400     History  Chief Complaint  Patient presents with   Fever    Kelli Barnes is a 37 y.o. female history of bipolar presented with 5 days of cough, fevers, generalized weakness.  Patient states that there are 2 people in the household have walk pneumonia and think she may have the same.  Patient been taking ibuprofen along with over-the-counter cough medicine which does seem to help slightly.  Patient's been able to tolerate food however did have an episode of nausea vomiting this morning that is since resolved.  Patient does endorse fevers at home.  Patient states she does smoke cigarettes however since being sick has not been smoking.  Patient says she is coughing up green sputum.  Patient states she did have asthma as a kid however does not have asthma anymore but does have albuterol inhaler at home that she has not been using as she has not been wheezing or feeling short of breath.  Patient denies shortness of breath, wheezing, neck pain, altered mental status, neck rigidity, abdominal pain, dysuria, chest pain  Home Medications Prior to Admission medications   Medication Sig Start Date End Date Taking? Authorizing Provider  amoxicillin (AMOXIL) 500 MG capsule Take 2 capsules (1,000 mg total) by mouth 2 (two) times daily for 5 days. 11/05/23 11/10/23 Yes Rishika Mccollom, Beverly Gust, PA-C  benzonatate (TESSALON) 100 MG capsule Take 1 capsule (100 mg total) by mouth every 8 (eight) hours. 11/05/23  Yes Aanshi Batchelder, Fayrene Fearing T, PA-C  ondansetron (ZOFRAN) 4 MG tablet Take 1 tablet (4 mg total) by mouth every 6 (six) hours. 11/05/23  Yes Primitivo Merkey, Beverly Gust, PA-C  promethazine (PHENERGAN) 25 MG tablet Take 1 tablet (25 mg total) by mouth every 6 (six) hours as needed for nausea. 11/01/11 11/08/11  Adam Phenix, MD      Allergies    Latex and Lamictal [lamotrigine]    Review of  Systems   Review of Systems  Constitutional:  Positive for fever.    Physical Exam Updated Vital Signs BP 110/87   Pulse 86   Temp 98.8 F (37.1 C)   Resp 18   Ht 5\' 9"  (1.753 m)   Wt 74.8 kg   LMP 10/31/2023   SpO2 99%   BMI 24.37 kg/m  Physical Exam Constitutional:      General: She is not in acute distress. HENT:     Right Ear: Tympanic membrane, ear canal and external ear normal.     Left Ear: Tympanic membrane, ear canal and external ear normal.     Nose: Congestion and rhinorrhea present.     Mouth/Throat:     Mouth: Mucous membranes are moist.     Pharynx: Posterior oropharyngeal erythema present.  Eyes:     Extraocular Movements: Extraocular movements intact.     Conjunctiva/sclera: Conjunctivae normal.     Pupils: Pupils are equal, round, and reactive to light.  Cardiovascular:     Rate and Rhythm: Normal rate and regular rhythm.     Pulses: Normal pulses.     Heart sounds: Normal heart sounds.  Pulmonary:     Effort: Pulmonary effort is normal. No respiratory distress.     Breath sounds: Rhonchi (right side) present.     Comments: Non productive cough in room Abdominal:     General: Abdomen is flat.     Palpations: Abdomen is  soft.     Tenderness: There is no abdominal tenderness. There is no guarding or rebound.  Musculoskeletal:        General: Normal range of motion.     Cervical back: Normal range of motion.  Skin:    General: Skin is warm.     Capillary Refill: Capillary refill takes less than 2 seconds.  Neurological:     General: No focal deficit present.     Mental Status: She is alert and oriented to person, place, and time.  Psychiatric:        Mood and Affect: Mood normal.     ED Results / Procedures / Treatments   Labs (all labs ordered are listed, but only abnormal results are displayed) Labs Reviewed  RESP PANEL BY RT-PCR (RSV, FLU A&B, COVID)  [id number]    EKG None  Radiology DG Chest Port 1 View Result Date:  11/05/2023 CLINICAL DATA:  Cough. EXAM: PORTABLE CHEST 1 VIEW COMPARISON:  04/17/2021. FINDINGS: There are new, heterogeneous opacities overlying the right mid lung zone partially obscuring the right hilum, compatible with pneumonia. Follow-up to clearing is recommended. Bilateral lung fields are otherwise clear. Bilateral costophrenic angles are clear. Normal cardio-mediastinal silhouette. No acute osseous abnormalities. The soft tissues are within normal limits. IMPRESSION: *There are new, heterogeneous opacities overlying the right mid lung zone partially obscuring the right hilum, compatible with pneumonia. Follow-up to clearing is recommended. Electronically Signed   By: Jules Schick M.D.   On: 11/05/2023 15:51    Procedures Procedures    Medications Ordered in ED Medications  amoxicillin (AMOXIL) capsule 1,000 mg (has no administration in time range)  acetaminophen (TYLENOL) tablet 650 mg (650 mg Oral Given 11/05/23 1441)    ED Course/ Medical Decision Making/ A&P                                 Medical Decision Making Amount and/or Complexity of Data Reviewed Radiology: ordered.  Risk OTC drugs. Prescription drug management.   Kelli Barnes 37 y.o. presented today for URI like symptoms. Working DDx that I considered at this time includes, but not limited to, viral illness, pharyngitis, mono, sinusitis, electrolyte abnormality, AOM.  R/o DDx: viral illness, pharyngitis, mono, sinusitis, electrolyte abnormality: these diagnoses are not consistent with patient's history, presentation, physical exam, labs/imaging findings.  Review of prior external notes: 04/17/2021 ED  Unique Tests and My Interpretation:  Respiratory Panel: Negative Chest x-ray: Right middle lobe pneumonia  Social Determinants of Health: none  Discussion with Independent Historian: None  Discussion of Management of Tests: None  Risk: Medium: prescription drug management  Risk Stratification  Score: None  Plan: On exam patient was in no acute distress with stable vitals.  On exam patient did have rhonchorous lung sounds on the right side along with nonproductive cough in the room.  Patient does endorse point sputum so with her fevers at home do suspect she most likely has pneumonia and will add on chest x-ray with the swab from triage.  Will give patient some Tylenol in the meantime.  Offered breathing treatment with patient declined at this time.  Patient does note that she has not albuterol inhaler at home and so I encouraged her to use that if she does begin to feel short of breath or wheezing.  Patient's labs and imaging came back showing that she does have right middle lobe pneumonia which would explain her symptoms.  Patient was given her first dose of antibiotics here but will prescribe the rest.  Will prescribe patient Zofran because she becomes nauseous at home.  Patient did not have tenderness on my abdominal exam and no peritoneal signs and has had no episodes of nausea vomiting throughout the ED stay and spoke to the patient about the possibility of doing more lab work for the nausea however patient declined at this time she states this most likely from the fever and pneumonia which is reasonable at this time.  Patient was p.o. challenged successfully and safe to be discharged at this time.  Encourage patient use Tylenol every 6 hours needed for pain and to follow-up with a primary care provider along with staying hydrated and eat food as tolerated.  Patient was given return precautions.patient stable for discharge at this time.  Patient verbalized understanding of plan.  Discussed smoking cessation with patient and was they were offerred resources to help stop.  Total time was 5 min CPT code 16109.   This chart was dictated using voice recognition software.  Despite best efforts to proofread,  errors can occur which can change the documentation meaning.         Final  Clinical Impression(s) / ED Diagnoses Final diagnoses:  Pneumonia of right middle lobe due to infectious organism    Rx / DC Orders ED Discharge Orders          Ordered    benzonatate (TESSALON) 100 MG capsule  Every 8 hours        11/05/23 1604    doxycycline (VIBRAMYCIN) 100 MG capsule  2 times daily,   Status:  Discontinued        11/05/23 1604    ondansetron (ZOFRAN) 4 MG tablet  Every 6 hours        11/05/23 1605    amoxicillin (AMOXIL) 500 MG capsule  2 times daily        11/05/23 1613              Remi Deter 11/05/23 1620    Linwood Dibbles, MD 11/06/23 [phone]

## 2023-11-05 NOTE — ED Triage Notes (Signed)
Flu symptoms x5 days. Febrile. Body aches. Coughing congestion. Taking mucinex/acetaminophen and ibuprofen. Emesis this morning x1.  Denies medica HX, no daily meds.

## 2023-11-05 NOTE — ED Notes (Signed)
Water given for PO challenge.

## 2023-11-05 NOTE — Discharge Instructions (Addendum)
Please follow-up with the primary care provider of attached your for you today or your primary care provider regards recent ER visit.  Today your labs and imaging show that you have pneumonia in your right middle lung that is most likely causing your symptoms.  You may use Tylenol or ibuprofen every 6 hours as needed for pain.  I have also prescribed you Zofran over the nausea.  You are given 1 dose of antibiotics here however I will prescribe the rest for you.  Please drink plenty fluids and eat food as tolerated and if symptoms change or worsen please return to the ER.
